# Patient Record
Sex: Female | Born: 1970 | Race: Black or African American | Hispanic: No | Smoking: Never smoker
Health system: Southern US, Community
[De-identification: ages and names within clinical notes are randomized; demographics above are authoritative.]

## PROBLEM LIST (undated history)

## (undated) DIAGNOSIS — E039 Hypothyroidism, unspecified: Secondary | ICD-10-CM

## (undated) HISTORY — DX: Hypothyroidism, unspecified: E03.9

## (undated) HISTORY — PX: COLONOSCOPY: SHX174

---

## 1998-10-30 ENCOUNTER — Ambulatory Visit (HOSPITAL_COMMUNITY): Admission: RE | Admit: 1998-10-30 | Discharge: 1998-10-30 | Payer: Self-pay | Admitting: Internal Medicine

## 1998-10-30 ENCOUNTER — Encounter: Payer: Self-pay | Admitting: Internal Medicine

## 2000-07-24 ENCOUNTER — Other Ambulatory Visit: Admission: RE | Admit: 2000-07-24 | Discharge: 2000-07-24 | Payer: Self-pay | Admitting: Obstetrics and Gynecology

## 2002-10-22 ENCOUNTER — Ambulatory Visit (HOSPITAL_COMMUNITY): Admission: RE | Admit: 2002-10-22 | Discharge: 2002-10-22 | Payer: Self-pay | Admitting: Obstetrics and Gynecology

## 2002-10-22 ENCOUNTER — Encounter: Payer: Self-pay | Admitting: Obstetrics and Gynecology

## 2004-11-01 ENCOUNTER — Other Ambulatory Visit: Admission: RE | Admit: 2004-11-01 | Discharge: 2004-11-01 | Payer: Self-pay | Admitting: Obstetrics and Gynecology

## 2005-12-05 ENCOUNTER — Other Ambulatory Visit: Admission: RE | Admit: 2005-12-05 | Discharge: 2005-12-05 | Payer: Self-pay | Admitting: Obstetrics and Gynecology

## 2007-06-10 ENCOUNTER — Encounter (INDEPENDENT_AMBULATORY_CARE_PROVIDER_SITE_OTHER): Payer: Self-pay | Admitting: Obstetrics and Gynecology

## 2007-06-10 ENCOUNTER — Inpatient Hospital Stay (HOSPITAL_COMMUNITY): Admission: RE | Admit: 2007-06-10 | Discharge: 2007-06-12 | Payer: Self-pay | Admitting: Obstetrics and Gynecology

## 2008-10-18 ENCOUNTER — Ambulatory Visit (HOSPITAL_COMMUNITY): Admission: RE | Admit: 2008-10-18 | Discharge: 2008-10-18 | Payer: Self-pay | Admitting: Obstetrics and Gynecology

## 2010-05-20 ENCOUNTER — Encounter: Payer: Self-pay | Admitting: Internal Medicine

## 2010-09-11 NOTE — Op Note (Signed)
NAME:  Sharon Barron, Sharon Barron                   ACCOUNT NO.:  [id number]   MEDICAL RECORD NO.:  [id number]          PATIENT TYPE:  INP   LOCATION:  9310                          FACILITY:  [facility]   PHYSICIAN:  Dois Davenport A. Rivard, M.D. DATE OF BIRTH:  [birth year]   DATE OF PROCEDURE:  06/10/2007  DATE OF DISCHARGE:                               OPERATIVE REPORT   PREOPERATIVE DIAGNOSIS:  Symptomatic large uterine fibroids.   POSTOPERATIVE DIAGNOSIS:  Symptomatic large uterine fibroids.   ANESTHESIA:  General, Dr. Tacy Dura.   PROCEDURE:  Uterine myomectomy.   SURGEON:  Dr. Estanislado Pandy.   ASSISTANT:  Henreitta Leber.   ESTIMATED BLOOD LOSS:  900 mL.   PROCEDURE:  After being informed of the planned procedure with the  possible complications including bleeding, infection, injury to bladder,  bowels or ureters, postoperative tubal occlusion, postoperative  hyperthermia, risk for requiring blood transfusion and risk of  hysterectomy, informed consent is obtained.  The patient is taken to OR  #3, given general anesthesia with endotracheal intubation without  complication.  She is placed in the dorsal decubitus position with knees  and hips flexed, prepped and draped in a sterile fashion including a  vaginal prep.  A Foley catheter is inserted in her bladder, a speculum  is inserted in the vagina.  The anterior lip of the cervix was grasped  with a tenaculum forceps.  The cervix is easily dilated with Hegar  dilator which then allows easy entry of an intrauterine balloon catheter  inflated with a 5 mL of saline.  The speculum and the tenaculum are  removed and the intrauterine Foley catheter is connected to a tube and a  syringe of diluted methylene blue.  The legs of the patient are then  straightened up and she is draped in a sterile fashion.   Her uterine size is approximately 24 weeks and we infiltrate the midline  from the umbilicus to the symphysis using Marcaine 0.25, 20 mL.  A  midline incision is  performed with knife and is brought down sharply to  the fascia.  The fascia is incised in a midline fashion.  The linea alba  is dissected and peritoneum is entered in a midline fashion.  The uterus  is easily exteriorized and we note multiple large fibroids with 2 fundal  subserosal, two large mid body posterior fibroids and two very large  lower uterine segments may be cervical fibroids and then small  subserosal ones. The left tube appears normal, the left ovary is normal,  has a little corpus luteum. The right tube is normal and the right ovary  is normal.  We decide to proceed with our initial incision being midline  and fundal to access a majority of the larger fibroids.  This is done  after infiltrating with vasopressin at a dilution of 100 units in 200 mL  of saline.  The serosa is then opened using cauterization. Fibroids are  grasped and sharply and bluntly dissected away from the myometrium. That  midline incision is now extended transversely on the fundus to access  more  fibroids and this is done again after infiltrating with  vasopressin. Multiple fibroids are removed through this cross-like  incision until we are unable to access more fibroids through this  incision.  We then proceed with systematic closure of the myometrium in  multiple planes of figure-of-eight stitches of zero Vicryl.  The  incision serosa is then closed with a baseball suture of 2-0 Vicryl.  Hemostasis is adequate.  Please note that during the dissection of the  main right cornual fibroid, we stay away from the tube which was  preoperatively blocked by the fibroid and we do the same for the main  left cornual fibroid staying away from the tube which was also  preoperatively blocked by the fibroid.   We then place our attention on the posterior wall of the uterus and we  extend the midline incision on the posterior wall to access four more  fibroids which are grasped and dissected bluntly and sharply  from the  myometrium.  The myometrium is again closed in multiple layers of figure-  of-eight stitches of #0 Vicryl and the serosa is closed with a baseball  suture of 2-0 Vicryl.  Hemostasis is deemed adequate.   At this point of the procedure, estimated blood loss is approximately  700 mL and because the patient is a participant in an autotransfusion  program, the decision is made to start giving her her 2 units of packed  red cells back.  We then put our attention on the lower uterine segment  anterior fibroids.  Because of the greatly reduction in size of the  uterus, retractors were placed and the bowels were retracted with  covered sponges.  Evaluation of this lower uterine segment reveals a 7-  cm right lower uterine segment fibroid which appears to be mainly a  broad ligament fibroid in  very close proximity to the right uterine  artery.  We then also note an 8 cm cervical fibroid which is very low in  the pelvis.  The broad ligament anteriorly is opened sharply and the  bladder is dissected away from these two fibroids.  We start with the  right lateral questionable round ligament fibroid by grasping it after  infiltrating its capsule with vasopressin at the same dilution  previously mentioned. We are very careful to apply traction towards the  left to stay away from the vascular pedicle of the uterus.  We are able  to sharply and bluntly dissect the fibroid away completely which leaves  the uterine artery completely skeletonized.  During that process, the  uterine vein is partially lacerated.  This vein is grasped with the  right angle and a suture is applied which is inefficient.  The ureter is  identified away from our site and again the breech in the uterine vein  is grasped with a right angle clamp and is now controlled with placement  of five hemoclip.   Hemostasis is then deemed adequate.   The bladder is dissected further away down to have access to the  cervical  fibroid.  This fibroid is very large and compromises the normal  direction of the cervix and so meticulous sharp dissection is performed  until the fibroid is completely removed.   Using multiple layers of figure-of-eight stitches of zero Vicryl, we are  able to reconstruct the lower uterine segments myometrium as well as the  right lower uterine segment myometrium.  A small bleeding on the left  lower uterine segment is controlled with two  figure-of-eight stitches of  2-0 Vicryl.  The pelvis is then profusely irrigated with warm saline and  hemostasis is deemed adequate.   Gelfoam is applied to the lower uterine segment and the anterior broad  ligament is brought back to the mid body of the uterus where it was  initially sectioned and it is closed with a running suture of 2-0  Vicryl.   We again profusely irrigate the pelvis with warm saline and note a  satisfactory hemostasis on all suture lines  Two sheets of intercede are  deposited on all suture lines to cover them completely. Packs and  retractors are removed.   The peritoneum is closed with a running suture of 2-0 Vicryl. Under  fascia hemostasis is completed with cautery and the fascia is closed  with two running sutures of zero Vicryl meeting midline.  The wound is  then irrigated with warm saline and hemostasis was achieved with  cauterization.  The skin is closed with a subcuticular suture of 3-0  Monocryl and Steri-Strips.   The uterine Foley catheter is removed after deflating the balloon.   Instruments and sponge count is complete x2. Estimated blood loss is at  900 mL while the patient received 2 units of packed red cells during the  procedure.   Finally the final count of fibroid removal is at 32 fibroids have been  removed for a total weight of 900 grams.   Instruments and sponge count is complete x2 and the procedure is very  well tolerated by the patient who is taken to recovery room in a well  and stable  condition.   SPECIMEN:  32 fibroids weighing 900 grams were sent to pathology.      Crist Fat Rivard, M.D.  Electronically Signed     SAR/MEDQ  D:  06/10/2007  T:  06/12/2007  Job:  [id number]

## 2010-09-11 NOTE — H&P (Signed)
NAME:  Sharon Barron, Sharon Barron                   ACCOUNT NO.:  [id number]   MEDICAL RECORD NO.:  [id number]          PATIENT TYPE:  AMB   LOCATION:  SDC                           [facility]ILITY:  [facility]   PHYSICIAN:  Dois Davenport A. Rivard, M.D. DATE OF BIRTH:  Dec 12, 1970   DATE OF ADMISSION:  DATE OF DISCHARGE:                              HISTORY & PHYSICAL   HISTORY OF PRESENT ILLNESS:  Sharon Barron is a 40 year old married [place]  female para 1-0-0-1 who presents for abdominal myomectomy because of  symptomatic uterine fibroids.  For the past 4 years, the patient has  experienced worsening symptomatic uterine fibroids in the form of  significant pelvic pressure, occasional urinary incontinence, incomplete  emptying of her bladder and postvoid dribbling.  The patient also has a  history of multifactorial infertility.  Her menstrual flow which lasts  only 3 days is characterized by her need to change a pad every 1-2 hours  accompanied by clots and 5/10 menstrual cramping on a 10-point pain  scale.  The patient only has to take over-the-counter analgesia,  however, to relieve her dysmenorrhea.  She denies any intermenstrual  bleeding, any dyspareunia or fever.  A pelvic ultrasound in August 2007  revealed a uterus measuring 6.6 cm x 11 cm x 10.4 cm.  Approximately 8  fibroids were measured at that time ranging from 2.5 cm to 6 cm.  During  that same interim, the patient's uterus was found on physical exam to be  approximately 18 weeks size.  More recently, the patient's physical exam  revealed a uterus measuring 26-28 weeks size.  The patient was given  both medical and surgical management options for her fibroids.  However  given the protracted nature of her symptoms, her expanding abdominal  girth and desire to preserve fertility, the patient has decided to  proceed with abdominal myomectomy.   PAST MEDICAL HISTORY:  OB history gravida 1, para 1-0-0-1.  The patient  had a spontaneous vaginal delivery in 1999.   GYN history menarche at 39  years old.  Last menstrual period May 23, 2007.  The patient is not  using any method of contraception.  She has a remote history of an  abnormal Pap smear.  However, her Pap smears since that time have been  normal with the most recent being November 2008.  She denies any history  of sexually transmitted diseases.  Medical history is negative.   SURGICAL HISTORY:  In 1990, she underwent an appendectomy.  She denies  any problems with anesthesia.  Denies any history of blood transfusion.   FAMILY HISTORY:  Positive for diabetes mellitus.   HABITS:  She does not use tobacco, alcohol or illicit drugs.   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   CURRENT MEDICATIONS:  1. INH 300 mg daily (the patient had a positive PPD with a negative      chest x-ray, however, was prescribed this as a precaution).  2. Slow Fe 1 tablet daily.  3. Centrum multivitamins 1  tablet daily.   ALLERGIES:  LORCET which causes hallucinations.   REVIEW OF SYSTEMS:  The patient denies any chest pain, shortness of  breath, any nausea, vomiting, diarrhea, any headache, vision changes,  myalgias.  Except as is mentioned in history of present illness,  the  patient's review of system is negative.   PHYSICAL EXAMINATION:  VITAL SIGNS:  Blood pressure 124/72, pulse 78,  weight is 148.  Height is 5 feet 1 inch.  NECK:  Supple without masses.  There is no thyromegaly or cervical  adenopathy.  HEART:  Regular rate and rhythm.  LUNGS:  Clear.  BACK:  No CVA tenderness.  ABDOMEN:  There is a firm mass which is tender arising from the pelvis  to approximately 3 fingerbreadths above the umbilicus along its right  upper border and approximately 1 fingerbreadth above the umbilicus along  its left upper border.  There is no appreciable organomegaly.  EXTREMITIES:  Without clubbing, cyanosis or edema.  PELVIC:  EG/BUS is within  normal limits.  Vagina is rugous.  Cervix is  nontender without lesions.  Uterus appears 26-28 weeks size, irregular  and mildly tender.  Adnexa, no palpable masses or tenderness.   IMPRESSION:  1. Symptomatic uterine fibroids.  2. Menorrhagia.  3. Dysmenorrhea.   DISPOSITION:  A discussion was held with the patient regarding the  indications for her procedure along with its risks which include, but  are not limited to reaction to anesthesia, damage to adjacent organs,  infection and excessive bleeding.  The patient has already banked 2  units of blood which was given autologously.  She has consented to  proceed with a myomectomy at [facility] June 10, 2007 at 8:30 a.m.      [place] J. Adline Peals.      Crist Fat Rivard, M.D.  Electronically Signed    EJP/MEDQ  D:  06/04/2007  T:  06/05/2007  Job:  [id number]

## 2010-09-14 NOTE — Discharge Summary (Signed)
NAME:  Sharon Barron, Sharon Barron                   ACCOUNT NO.:  [id number]   MEDICAL RECORD NO.:  [id number]          PATIENT TYPE:  INP   LOCATION:  9310                          [facility]ACILITY:  [facility]   PHYSICIAN:  Dois Davenport A. Rivard, M.D. DATE OF BIRTH:  January 23, 1971   DATE OF ADMISSION:  06/10/2007  DATE OF DISCHARGE:  06/12/2007                               DISCHARGE SUMMARY   DISCHARGE DIAGNOSES:  Symptomatic uterine fibroids, menorrhagia, and  dysmenorrhea.   OPERATION:  On the date of admission, the patient underwent an abdominal  myomectomy along with transfusion of 2 units of autologous packed red  blood cells, tolerating procedure well.  The patient had removed  multiple fibroids equaling 32 parts with an aggregate weight of 900.8 g.   HISTORY OF PRESENT ILLNESS:  Ms. Myhand is a 40 year old married [place]  female, para 1-0-0-1, who presents for an abdominal myomectomy because  of symptomatic uterine fibroids.  Please see the patient's dictated  history and physical examination for details.   PREOPERATIVE PHYSICAL EXAMINATION:  VITAL SIGNS:  Blood pressure 124/72,  pulse is 78, weight is 148, height 5 feet 1 inch tall.  GENERAL:  Within normal limit.  ABDOMEN:  It was noted on abdominal exam that there was a firm mass,  which was tender, arising from the pelvis approximately 3 fingerbreadths  above the umbilicus along its right upper border, and approximately 1  fingerbreadth above the umbilicus along the left upper border.  There  was no appreciable organomegaly.  PELVIC:  EG, BUS was within normal limits.  Vagina was rugosed.  Cervix  was nontender without lesions.  The uterus appeared 26 to 28 weeks'  size, irregular, and mildly tender.  Adnexa revealed no palpable masses  or tenderness.   HOSPITAL COURSE:  On the date of admission, the patient underwent  aforementioned procedures, tolerated them all well.  Postoperative  course was marked by mild tachycardia with the patient's heart rate  ranging from 100 to 117.  The patient's postoperative hemoglobin  (following transfusion of 2 units of autologous packed red blood cells)  was 10.9.  The patient's preop hemoglobin was 13.2.  On postop day # 2,  the patient had resumed bowel and bladder function, and was asymptomatic  with her anemic state, and was therefore deemed ready for discharge  home.   DISCHARGE MEDICATIONS:  The patient was advised to follow instructions  on her home medication reconciliation form.  She was further prescribed:  1. Colace 100 mg twice daily until her bowel movements are regular.  2. Ibuprofen 600 mg with food every 6 hours for 5 days and then as      needed for pain.  3. 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  4. Dilaudid 2 mg 1-2 tablets every 4 hours as needed for moderate-to-      severe pain.  5. Iron 1 tablet twice daily.   FOLLOWUP:  The patient is scheduled for a 6 weeks' postoperative visit  with Dr. Estanislado Pandy on July 21, 2007 at 10:15 a.m.   DISCHARGE INSTRUCTIONS:  The patient was given a copy of [facility] postoperative instruction sheet.  She was further  advised to avoid driving for 2 weeks, heavy lifting for 4 weeks, and of  course a 6 weeks that she may shower, she may walk up steps.  The  patient's diet was without restriction.   FINAL PATHOLOGY:  Uterine fibroid, excision:  Leiomyomata.      Elmira J. Adline Peals.      Crist Fat Rivard, M.D.  Electronically Signed    EJP/MEDQ  D:  07/16/2007  T:  07/17/2007  Job:  [id number]

## 2011-01-18 LAB — CBC
HCT: 30 — ABNORMAL LOW
HCT: 39
Hemoglobin: 10.2 — ABNORMAL LOW
Hemoglobin: 10.9 — ABNORMAL LOW
Hemoglobin: 13.2
MCHC: 33.7
MCHC: 34.1
MCHC: 34.4
MCV: 87.4
MCV: 87.9
Platelets: 272
RBC: 3.43 — ABNORMAL LOW
RBC: 3.66 — ABNORMAL LOW
RBC: 4.44
RDW: 13.5
WBC: 13.9 — ABNORMAL HIGH
WBC: 16 — ABNORMAL HIGH
WBC: 4.8

## 2011-01-18 LAB — TYPE AND SCREEN
ABO/RH(D): O POS
Antibody Screen: NEGATIVE

## 2011-01-18 LAB — URINALYSIS, ROUTINE W REFLEX MICROSCOPIC
Bilirubin Urine: NEGATIVE
Glucose, UA: NEGATIVE
Hgb urine dipstick: NEGATIVE
Protein, ur: NEGATIVE
Specific Gravity, Urine: 1.01
Urobilinogen, UA: 0.2

## 2011-01-18 LAB — PREGNANCY, URINE: Preg Test, Ur: NEGATIVE

## 2011-04-29 ENCOUNTER — Other Ambulatory Visit: Payer: Self-pay | Admitting: Internal Medicine

## 2011-04-29 DIAGNOSIS — Z1231 Encounter for screening mammogram for malignant neoplasm of breast: Secondary | ICD-10-CM

## 2011-05-13 ENCOUNTER — Ambulatory Visit
Admission: RE | Admit: 2011-05-13 | Discharge: 2011-05-13 | Disposition: A | Discharge status: Home / Self Care | Payer: Commercial Indemnity | Source: Ambulatory Visit | Attending: Internal Medicine | Admitting: Internal Medicine

## 2011-05-13 DIAGNOSIS — Z1231 Encounter for screening mammogram for malignant neoplasm of breast: Secondary | ICD-10-CM

## 2013-05-11 ENCOUNTER — Other Ambulatory Visit: Payer: Self-pay

## 2013-05-11 DIAGNOSIS — Z1231 Encounter for screening mammogram for malignant neoplasm of breast: Secondary | ICD-10-CM

## 2013-05-12 ENCOUNTER — Ambulatory Visit
Admission: RE | Admit: 2013-05-12 | Discharge: 2013-05-12 | Disposition: A | Discharge status: Home / Self Care | Payer: Commercial Indemnity | Source: Ambulatory Visit

## 2013-05-12 DIAGNOSIS — Z1231 Encounter for screening mammogram for malignant neoplasm of breast: Secondary | ICD-10-CM

## 2015-06-01 DIAGNOSIS — E039 Hypothyroidism, unspecified: Secondary | ICD-10-CM | POA: Insufficient documentation

## 2015-11-21 DIAGNOSIS — D259 Leiomyoma of uterus, unspecified: Secondary | ICD-10-CM | POA: Insufficient documentation

## 2015-11-21 DIAGNOSIS — D219 Benign neoplasm of connective and other soft tissue, unspecified: Secondary | ICD-10-CM | POA: Insufficient documentation

## 2016-04-29 HISTORY — PX: ABDOMINAL HYSTERECTOMY: SHX81

## 2016-12-12 DIAGNOSIS — H5213 Myopia, bilateral: Secondary | ICD-10-CM | POA: Insufficient documentation

## 2016-12-12 DIAGNOSIS — H16223 Keratoconjunctivitis sicca, not specified as Sjogren's, bilateral: Secondary | ICD-10-CM | POA: Insufficient documentation

## 2017-01-01 DIAGNOSIS — N8502 Endometrial intraepithelial neoplasia [EIN]: Secondary | ICD-10-CM | POA: Insufficient documentation

## 2017-05-29 ENCOUNTER — Ambulatory Visit (INDEPENDENT_AMBULATORY_CARE_PROVIDER_SITE_OTHER): Payer: No Typology Code available for payment source | Admitting: Nurse Practitioner

## 2017-05-29 ENCOUNTER — Encounter: Payer: Self-pay | Admitting: Nurse Practitioner

## 2017-05-29 VITALS — BP 120/86 | HR 84 | Temp 97.5°F | Ht 61.0 in | Wt 154.0 lb

## 2017-05-29 DIAGNOSIS — E039 Hypothyroidism, unspecified: Secondary | ICD-10-CM | POA: Diagnosis not present

## 2017-05-29 DIAGNOSIS — Z86018 Personal history of other benign neoplasm: Secondary | ICD-10-CM | POA: Insufficient documentation

## 2017-05-29 LAB — T4, FREE: Free T4: 0.85 ng/dL (ref 0.60–1.60)

## 2017-05-29 LAB — TSH: TSH: 1.17 u[IU]/mL (ref 0.35–4.50)

## 2017-05-29 MED ORDER — LEVOTHYROXINE SODIUM 50 MCG PO TABS: 50.0000 ug | ORAL_TABLET | Freq: Every day | ORAL | 3 refills | Status: DC

## 2017-05-29 NOTE — Patient Instructions (Addendum)
Stable thyroid function. Continue current dose.

## 2017-05-29 NOTE — Progress Notes (Signed)
Subjective:  Patient ID: Sharon Barron, female    DOB: 1970-11-23  Age: 47 y.o. MRN: [id number]  CC: Establish Care (est care/ thyroid and medication)  Thyroid Problem  Presents for follow-up visit. Patient reports no anxiety, cold intolerance, constipation, depressed mood, diaphoresis, diarrhea, dry skin, fatigue, hair loss, heat intolerance, hoarse voice, leg swelling, menstrual problem, nail problem, palpitations, tremors, visual change, weight gain or weight loss. The symptoms have been stable.  labs last done 07/2016 by [facility]. Use of current dose for several years.  Outpatient Medications Prior to Visit  Medication Sig Dispense Refill  . levothyroxine (SYNTHROID, LEVOTHROID) 50 MCG tablet TAKE ONE TABLET BY MOUTH ONCE DAILY    . aspirin EC 81 MG tablet Take by mouth.     No facility-administered medications prior to visit.    Social History   Socioeconomic History  . Marital status: Married    Spouse name: Not on file  . Number of children: Not on file  . Years of education: Not on file  . Highest education level: Not on file  Social Needs  . Financial resource strain: Not on file  . Food insecurity - worry: Not on file  . Food insecurity - inability: Not on file  . Transportation needs - medical: Not on file  . Transportation needs - non-medical: Not on file  Occupational History  . Not on file  Tobacco Use  . Smoking status: Never Smoker  . Smokeless tobacco: Never Used  Substance and Sexual Activity  . Alcohol use: No    Frequency: Never  . Drug use: No  . Sexual activity: Yes    Birth control/protection: Surgical  Other Topics Concern  . Not on file  Social History Narrative  . Not on file   Family History  Family history unknown: Yes    ROS See HPI  Objective:  BP 120/86   Pulse 84   Temp (!) 97.5 F (36.4 C)   Ht 5\' 1"  (1.549 m)   Wt 154 lb (69.9 kg)   SpO2 99%   BMI 29.10 kg/m   BP Readings from Last 3 Encounters:  05/29/17 120/86    Wt  Readings from Last 3 Encounters:  05/29/17 154 lb (69.9 kg)    Physical Exam  Constitutional: She is oriented to person, place, and time. No distress.  Neck: Normal range of motion. Neck supple. No thyromegaly present.  Cardiovascular: Normal rate, regular rhythm and normal heart sounds.  Pulmonary/Chest: Effort normal and breath sounds normal.  Lymphadenopathy:    She has no cervical adenopathy.  Neurological: She is alert and oriented to person, place, and time.  Skin: Skin is warm and dry.  Psychiatric: She has a normal mood and affect. Her behavior is normal. Thought content normal.  Vitals reviewed.   Lab Results  Component Value Date   WBC 16.0 (H) 06/12/2007   HGB 10.2 (L) 06/12/2007   HCT 30.0 (L) 06/12/2007   PLT 207 06/12/2007   TSH 1.17 05/29/2017    Mm Digital Screening  Result Date: 05/12/2013 CLINICAL DATA:  Screening. EXAM: DIGITAL SCREENING BILATERAL MAMMOGRAM WITH CAD COMPARISON:  Previous exam(s). ACR Breast Density Category c: The breast tissue is heterogeneously dense, which may obscure small masses. FINDINGS: There are no findings suspicious for malignancy. Images were processed with CAD. IMPRESSION: No mammographic evidence of malignancy. A result letter of this screening mammogram will be mailed directly to the patient. RECOMMENDATION: Screening mammogram in one year. (Code:SM-B-01Y) BI-RADS CATEGORY  1: Negative Electronically Signed   By: Lovey Newcomer M.D.   On: 05/12/2013 15:19   Updated mammogram done 02/2017 (normal per [facility])  Assessment & Plan:   Sharon Barron was seen today for establish care.  Diagnoses and all orders for this visit:  Hypothyroidism (acquired) -     TSH -     T4, free -     levothyroxine (SYNTHROID, LEVOTHROID) 50 MCG tablet; Take 1 tablet (50 mcg total) by mouth daily.   I have changed Sharon Barron. Veno's levothyroxine. I am also having her maintain her aspirin EC.  Meds ordered this encounter  Medications  . levothyroxine (SYNTHROID,  LEVOTHROID) 50 MCG tablet    Sig: Take 1 tablet (50 mcg total) by mouth daily.    Dispense:  90 tablet    Refill:  3    Order Specific Question:   Supervising Provider    Answer:   Sharon Barron [[id number]]    Follow-up: Return in about 6 months (around 11/26/2017) for CPE(fasting).  Sharon Lacy, NP

## 2017-08-05 ENCOUNTER — Telehealth: Payer: Self-pay | Admitting: Nurse Practitioner

## 2017-08-05 DIAGNOSIS — Z202 Contact with and (suspected) exposure to infections with a predominantly sexual mode of transmission: Secondary | ICD-10-CM

## 2017-08-05 DIAGNOSIS — R300 Dysuria: Secondary | ICD-10-CM

## 2017-08-05 NOTE — Telephone Encounter (Signed)
Ms. Sharon Barron called with complains of urinary frequency, dysuria, and blood in urine x 1week. She was treated for UTI 1week ago, but symptoms have not improved. She was treated with amoxicillin. She also had GC/chlamydia and trichomoniasis done. Test was negative. She will like urinalysis repeated. She will also like HIV and RPR checked due to husband being unfaithful. I informed her to go to lab tomorrow morning for blood draw and urine collection. She verbalized understanding.

## 2017-08-06 ENCOUNTER — Other Ambulatory Visit (INDEPENDENT_AMBULATORY_CARE_PROVIDER_SITE_OTHER): Payer: PRIVATE HEALTH INSURANCE

## 2017-08-06 ENCOUNTER — Ambulatory Visit: Payer: Commercial Indemnity | Admitting: Nurse Practitioner

## 2017-08-06 DIAGNOSIS — R300 Dysuria: Secondary | ICD-10-CM | POA: Diagnosis not present

## 2017-08-06 DIAGNOSIS — Z202 Contact with and (suspected) exposure to infections with a predominantly sexual mode of transmission: Secondary | ICD-10-CM

## 2017-08-08 LAB — URINALYSIS W MICROSCOPIC + REFLEX CULTURE
BACTERIA UA: NONE SEEN /HPF
Bilirubin Urine: NEGATIVE
GLUCOSE, UA: NEGATIVE
Hyaline Cast: NONE SEEN /LPF
KETONES UR: NEGATIVE
Nitrites, Initial: NEGATIVE
PH: 7.5 (ref 5.0–8.0)
Protein, ur: NEGATIVE
RBC / HPF: NONE SEEN /HPF (ref 0–2)
Specific Gravity, Urine: 1.004 (ref 1.001–1.03)

## 2017-08-08 LAB — URINE CULTURE
MICRO NUMBER: 90447320
SPECIMEN QUALITY: ADEQUATE

## 2017-08-08 LAB — RPR: RPR Ser Ql: NONREACTIVE

## 2017-08-08 LAB — CULTURE INDICATED

## 2017-08-08 LAB — HIV ANTIBODY (ROUTINE TESTING W REFLEX): HIV: NONREACTIVE

## 2017-09-10 ENCOUNTER — Encounter: Payer: Self-pay | Admitting: Nurse Practitioner

## 2017-09-10 ENCOUNTER — Ambulatory Visit: Payer: Commercial Indemnity | Admitting: Nurse Practitioner

## 2017-09-10 VITALS — BP 110/80 | HR 78 | Temp 98.2°F | Ht 61.0 in | Wt 154.4 lb

## 2017-09-10 DIAGNOSIS — Z136 Encounter for screening for cardiovascular disorders: Secondary | ICD-10-CM

## 2017-09-10 DIAGNOSIS — N951 Menopausal and female climacteric states: Secondary | ICD-10-CM

## 2017-09-10 DIAGNOSIS — Z13220 Encounter for screening for lipoid disorders: Secondary | ICD-10-CM | POA: Diagnosis not present

## 2017-09-10 DIAGNOSIS — Z0000 Encounter for general adult medical examination without abnormal findings: Secondary | ICD-10-CM

## 2017-09-10 DIAGNOSIS — Z0001 Encounter for general adult medical examination with abnormal findings: Secondary | ICD-10-CM

## 2017-09-10 LAB — CBC
HCT: 41.5 % (ref 36.0–46.0)
Hemoglobin: 14 g/dL (ref 12.0–15.0)
MCHC: 33.7 g/dL (ref 30.0–36.0)
MCV: 86.8 fl (ref 78.0–100.0)
Platelets: 339 10*3/uL (ref 150.0–400.0)
RBC: 4.78 Mil/uL (ref 3.87–5.11)
RDW: 13.6 % (ref 11.5–15.5)
WBC: 4.9 10*3/uL (ref 4.0–10.5)

## 2017-09-10 LAB — COMPREHENSIVE METABOLIC PANEL
ALBUMIN: 4 g/dL (ref 3.5–5.2)
ALK PHOS: 49 U/L (ref 39–117)
ALT: 13 U/L (ref 0–35)
AST: 15 U/L (ref 0–37)
BUN: 10 mg/dL (ref 6–23)
CO2: 25 mEq/L (ref 19–32)
Calcium: 9.4 mg/dL (ref 8.4–10.5)
Chloride: 107 mEq/L (ref 96–112)
Creatinine, Ser: 0.74 mg/dL (ref 0.40–1.20)
GFR: 107.19 mL/min (ref >60.00)
GLUCOSE: 90 mg/dL (ref 70–99)
POTASSIUM: 3.7 meq/L (ref 3.5–5.1)
Sodium: 140 mEq/L (ref 135–145)
TOTAL PROTEIN: 7.1 g/dL (ref 6.0–8.3)
Total Bilirubin: 0.5 mg/dL (ref 0.2–1.2)

## 2017-09-10 LAB — LIPID PANEL
CHOLESTEROL: 197 mg/dL (ref 0–200)
HDL: 52.5 mg/dL (ref >39.00)
LDL Cholesterol: 129 mg/dL — ABNORMAL HIGH (ref 0–99)
NonHDL: 144.12
Total CHOL/HDL Ratio: 4
Triglycerides: 77 mg/dL (ref 0.0–149.0)
VLDL: 15.4 mg/dL (ref 0.0–40.0)

## 2017-09-10 MED ORDER — VENLAFAXINE HCL ER 37.5 MG PO CP24: 37.5000 mg | ORAL_CAPSULE | Freq: Every day | ORAL | 2 refills | Status: DC

## 2017-09-10 NOTE — Progress Notes (Signed)
Subjective:    Patient ID: Sharon Barron, female    DOB: Sep 27, 1970, 46 y.o.   MRN: [id number]  Patient presents today for complete physical   HPI   Hot Flashes: She reports hot flashes in last 32months due to complete hysterectomy.describes as overwhelming hot sensation and breaking out in sweats. interfering with sleep. Denies any depression or anxiety. No other associated symptoms, no other known aggravating factor. She will like to use effexor to manage symptoms. Has not tried any home remedies.  Immunizations: (TDAP, Hep C screen, Pneumovax, Influenza, zoster)  Health Maintenance  Topic Date Due  . Flu Shot  11/27/2017  . Tetanus Vaccine  08/27/2021  . HIV Screening  Completed   Diet:regular.  Weight:  Wt Readings from Last 3 Encounters:  09/10/17 154 lb 6.4 oz (70 kg)  05/29/17 154 lb (69.9 kg)   Exercise:walking.  Fall Risk: Fall Risk  09/10/2017 05/29/2017  Falls in the past year? No No   Home Safety:home with daughter.  Depression/Suicide: Depression screen [facility] 2/9 09/10/2017 05/29/2017  Decreased Interest 0 0  Down, Depressed, Hopeless 0 0  PHQ - 2 Score 0 0   Mammogram (yearly, >73yrs):up to date.  Vision:up to date  Dental:up to date.   Medications and allergies reviewed with patient and updated if appropriate.  Patient Active Problem List   Diagnosis Date Noted  . S/P hysterectomy with oophorectomy 09/11/2017  . Vasomotor symptoms due to menopause 09/10/2017  . Hypothyroidism (acquired) 05/29/2017  . History of uterine fibroid 05/29/2017    Current Outpatient Medications on File Prior to Visit  Medication Sig Dispense Refill  . aspirin EC 81 MG tablet Take by mouth.    . levothyroxine (SYNTHROID, LEVOTHROID) 50 MCG tablet Take 1 tablet (50 mcg total) by mouth daily. 90 tablet 3   No current facility-administered medications on file prior to visit.     Past Medical History:  Diagnosis Date  . Hypothyroidism     Past Surgical History:    Procedure Laterality Date  . ABDOMINAL HYSTERECTOMY  2018  . COLONOSCOPY     2015 (normal per patient)    Social History   Socioeconomic History  . Marital status: Divorced    Spouse name: Not on file  . Number of children: 1  . Years of education: Not on file  . Highest education level: Not on file  Occupational History  . Not on file  Social Needs  . Financial resource strain: Not on file  . Food insecurity:    Worry: Not on file    Inability: Not on file  . Transportation needs:    Medical: Not on file    Non-medical: Not on file  Tobacco Use  . Smoking status: Never Smoker  . Smokeless tobacco: Never Used  Substance and Sexual Activity  . Alcohol use: No    Frequency: Never  . Drug use: No  . Sexual activity: Yes    Birth control/protection: Surgical  Lifestyle  . Physical activity:    Days per week: Not on file    Minutes per session: Not on file  . Stress: Not on file  Relationships  . Social connections:    Talks on phone: Not on file    Gets together: Not on file    Attends religious service: Not on file    Active member of club or organization: Not on file    Attends meetings of clubs or organizations: Not on file    Relationship  status: Not on file  Other Topics Concern  . Not on file  Social History Narrative  . Not on file    Family History  Family history unknown: Yes        Review of Systems  Constitutional: Negative for fever, malaise/fatigue and weight loss.  HENT: Negative for congestion and sore throat.   Eyes:       Negative for visual changes  Respiratory: Negative for cough and shortness of breath.   Cardiovascular: Negative for chest pain, palpitations and leg swelling.  Gastrointestinal: Negative for blood in stool, constipation, diarrhea and heartburn.  Genitourinary: Negative for dysuria, frequency and urgency.  Musculoskeletal: Negative for falls, joint pain and myalgias.  Skin: Negative for rash.  Neurological: Negative  for dizziness, sensory change and headaches.  Endo/Heme/Allergies: Does not bruise/bleed easily.  Psychiatric/Behavioral: Negative for depression, substance abuse and suicidal ideas. The patient is not nervous/anxious.     Objective:   Vitals:   09/10/17 0814  BP: 110/80  Pulse: 78  Temp: 98.2 F (36.8 C)  SpO2: 99%    Body mass index is 29.17 kg/m.   Physical Examination:  Physical Exam  Constitutional: She is oriented to person, place, and time. She appears well-developed. No distress.  HENT:  Right Ear: External ear normal.  Left Ear: External ear normal.  Nose: Nose normal.  Mouth/Throat: Oropharynx is clear and moist. No oropharyngeal exudate.  Eyes: Pupils are equal, round, and reactive to light. Conjunctivae and EOM are normal.  Neck: Normal range of motion. Neck supple. No thyromegaly present.  Cardiovascular: Normal rate, regular rhythm and normal heart sounds.  Pulmonary/Chest: Effort normal and breath sounds normal. No respiratory distress. She exhibits no tenderness.  Deferred breast exam to GYN  Abdominal: Soft. Bowel sounds are normal. She exhibits no distension. There is no tenderness.  Genitourinary:  Genitourinary Comments: Declined to GYN  Musculoskeletal: Normal range of motion. She exhibits no edema.  Lymphadenopathy:    She has no cervical adenopathy.  Neurological: She is alert and oriented to person, place, and time. She has normal reflexes.  Skin: Skin is warm and dry. No rash noted.  Psychiatric: She has a normal mood and affect. Her behavior is normal. Thought content normal.  Vitals reviewed.  ASSESSMENT and PLAN:  Shemica was seen today for annual exam.  Diagnoses and all orders for this visit:  Encounter for preventative adult health care exam with abnormal findings -     CBC -     Lipid panel -     Comprehensive metabolic panel  Encounter for lipid screening for cardiovascular disease -     Lipid panel  Vasomotor symptoms due to  menopause -     venlafaxine XR (EFFEXOR-XR) 37.5 MG 24 hr capsule; Take 1 capsule (37.5 mg total) by mouth daily with breakfast.  S/P hysterectomy with oophorectomy   No problem-specific Assessment & Plan notes found for this encounter.    Recent Results (from the past 2160 hour(s))  Urinalysis w microscopic + reflex cultur     Status: Abnormal   Collection Time: 08/06/17  8:19 AM  Result Value Ref Range   Color, Urine YELLOW YELLOW   APPearance CLEAR CLEAR   Specific Gravity, Urine 1.004 1.001 - 1.03   pH 7.5 5.0 - 8.0   Glucose, UA NEGATIVE NEGATIVE   Bilirubin Urine NEGATIVE NEGATIVE   Ketones, ur NEGATIVE NEGATIVE   Hgb urine dipstick TRACE (A) NEGATIVE   Protein, ur NEGATIVE NEGATIVE   Nitrites,  Initial NEGATIVE NEGATIVE   Leukocyte Esterase 1+ (A) NEGATIVE   WBC, UA 0-5 0 - 5 /HPF   RBC / HPF NONE SEEN 0 - 2 /HPF   Squamous Epithelial / LPF 0-5 < OR = 5 /HPF   Bacteria, UA NONE SEEN NONE SEEN /HPF   Hyaline Cast NONE SEEN NONE SEEN /LPF  RPR     Status: None   Collection Time: 08/06/17  8:19 AM  Result Value Ref Range   RPR Ser Ql NON-REACTIVE NON-REACTI  HIV antibody     Status: None   Collection Time: 08/06/17  8:19 AM  Result Value Ref Range   HIV 1&2 Ab, 4th Generation NON-REACTIVE NON-REACTI    Comment: HIV-1 antigen and HIV-1/HIV-2 antibodies were not detected. There is no laboratory evidence of HIV infection. Marland Kitchen PLEASE NOTE: This information has been disclosed to you from records whose confidentiality may be protected by state law.  If your state requires such protection, then the state law prohibits you from making any further disclosure of the information without the specific written consent of the person to whom it pertains, or as otherwise permitted by law. A general authorization for the release of medical or other information is NOT sufficient for this purpose. . For additional information please refer  to http://education.questdiagnostics.com/faq/FAQ106 (This link is being provided for informational/ educational purposes only.) . Marland Kitchen The performance of this assay has not been clinically validated in patients less than 46 years old. Marland Kitchen   REFLEXIVE URINE CULTURE     Status: None   Collection Time: 08/06/17  8:19 AM  Result Value Ref Range   REFLEXIVE URINE CULTURE CULTURE INDICATED - RESULTS TO FOLLOW   Urine Culture     Status: None   Collection Time: 08/06/17  8:19 AM  Result Value Ref Range   MICRO NUMBER: [id number]    SPECIMEN QUALITY: ADEQUATE    Sample Source URINE    STATUS: FINAL    Result:      Three or more organisms present, each greater than 10,000 cu/mL. May represent normal flora contamination from external genitalia. No further testing is required.  CBC     Status: None   Collection Time: 09/10/17  8:56 AM  Result Value Ref Range   WBC 4.9 4.0 - 10.5 K/uL   RBC 4.78 3.87 - 5.11 Mil/uL   Platelets 339.0 150.0 - 400.0 K/uL   Hemoglobin 14.0 12.0 - 15.0 g/dL   HCT 41.5 36.0 - 46.0 %   MCV 86.8 78.0 - 100.0 fl   MCHC 33.7 30.0 - 36.0 g/dL   RDW 13.6 11.5 - 15.5 %  Lipid panel     Status: Abnormal   Collection Time: 09/10/17  8:56 AM  Result Value Ref Range   Cholesterol 197 0 - 200 mg/dL    Comment: ATP III Classification       Desirable:  < 200 mg/dL               Borderline High:  200 - 239 mg/dL          High:  > = 240 mg/dL   Triglycerides 77.0 0.0 - 149.0 mg/dL    Comment: Normal:  <150 mg/dLBorderline High:  150 - 199 mg/dL   HDL 52.50 >39.00 mg/dL   VLDL 15.4 0.0 - 40.0 mg/dL   LDL Cholesterol 129 (H) 0 - 99 mg/dL   Total CHOL/HDL Ratio 4     Comment:  Men          Women1/2 Average Risk     3.4          3.3Average Risk          5.0          4.42X Average Risk          9.6          7.13X Average Risk          15.0          11.0                       NonHDL 144.12     Comment: NOTE:  Non-HDL goal should be 30 mg/dL higher than patient's LDL goal  (i.e. LDL goal of < 70 mg/dL, would have non-HDL goal of < 100 mg/dL)  Comprehensive metabolic panel     Status: None   Collection Time: 09/10/17  8:56 AM  Result Value Ref Range   Sodium 140 135 - 145 mEq/L   Potassium 3.7 3.5 - 5.1 mEq/L   Chloride 107 96 - 112 mEq/L   CO2 25 19 - 32 mEq/L   Glucose, Bld 90 70 - 99 mg/dL   BUN 10 6 - 23 mg/dL   Creatinine, Ser 0.74 0.40 - 1.20 mg/dL   Total Bilirubin 0.5 0.2 - 1.2 mg/dL   Alkaline Phosphatase 49 39 - 117 U/L   AST 15 0 - 37 U/L   ALT 13 0 - 35 U/L   Total Protein 7.1 6.0 - 8.3 g/dL   Albumin 4.0 3.5 - 5.2 g/dL   Calcium 9.4 8.4 - 10.5 mg/dL   GFR 107.19 >60.00 mL/min   Follow up: Return in about 3 months (around 12/11/2017) for hot flashes.  Wilfred Lacy, NP

## 2017-09-10 NOTE — Patient Instructions (Addendum)
Go to lab for blood draw. Sign medical release to get records from PAP report from GYN.  Send mychart message by 09/19/2017 if any adverse effects with effexor.  Menopause Menopause is the normal time of life when menstrual periods stop completely. Menopause is complete when you have missed 12 consecutive menstrual periods. It usually occurs between the ages of 55 years and 73 years. Very rarely does a woman develop menopause before the age of 25 years. At menopause, your ovaries stop producing the female hormones estrogen and progesterone. This can cause undesirable symptoms and also affect your health. Sometimes the symptoms may occur 4-5 years before the menopause begins. There is no relationship between menopause and:  Oral contraceptives.  Number of children you had.  Race.  The age your menstrual periods started (menarche).  Heavy smokers and very thin women may develop menopause earlier in life. What are the causes?  The ovaries stop producing the female hormones estrogen and progesterone. Other causes include:  Surgery to remove both ovaries.  The ovaries stop functioning for no known reason.  Tumors of the pituitary gland in the brain.  Medical disease that affects the ovaries and hormone production.  Radiation treatment to the abdomen or pelvis.  Chemotherapy that affects the ovaries.  What are the signs or symptoms?  Hot flashes.  Night sweats.  Decrease in sex drive.  Vaginal dryness and thinning of the vagina causing painful intercourse.  Dryness of the skin and developing wrinkles.  Headaches.  Tiredness.  Irritability.  Memory problems.  Weight gain.  Bladder infections.  Hair growth of the face and chest.  Infertility. More serious symptoms include:  Loss of bone (osteoporosis) causing breaks (fractures).  Depression.  Hardening and narrowing of the arteries (atherosclerosis) causing heart attacks and strokes.  How is this  diagnosed?  When the menstrual periods have stopped for 12 straight months.  Physical exam.  Hormone studies of the blood. How is this treated? There are many treatment choices and nearly as many questions about them. The decisions to treat or not to treat menopausal changes is an individual choice made with your health care provider. Your health care provider can discuss the treatments with you. Together, you can decide which treatment will work best for you. Your treatment choices may include:  Hormone therapy (estrogen and progesterone).  Non-hormonal medicines.  Treating the individual symptoms with medicine (for example antidepressants for depression).  Herbal medicines that may help specific symptoms.  Counseling by a psychiatrist or psychologist.  Group therapy.  Lifestyle changes including: ? Eating healthy. ? Regular exercise. ? Limiting caffeine and alcohol. ? Stress management and meditation.  No treatment.  Follow these instructions at home:  Take the medicine your health care provider gives you as directed.  Get plenty of sleep and rest.  Exercise regularly.  Eat a diet that contains calcium (good for the bones) and soy products (acts like estrogen hormone).  Avoid alcoholic beverages.  Do not smoke.  If you have hot flashes, dress in layers.  Take supplements, calcium, and vitamin D to strengthen bones.  You can use over-the-counter lubricants or moisturizers for vaginal dryness.  Group therapy is sometimes very helpful.  Acupuncture may be helpful in some cases. Contact a health care provider if:  You are not sure you are in menopause.  You are having menopausal symptoms and need advice and treatment.  You are still having menstrual periods after age 19 years.  You have pain with intercourse.  Menopause  is complete (no menstrual period for 12 months) and you develop vaginal bleeding.  You need a referral to a specialist (gynecologist,  psychiatrist, or psychologist) for treatment. Get help right away if:  You have severe depression.  You have excessive vaginal bleeding.  You fell and think you have a broken bone.  You have pain when you urinate.  You develop leg or chest pain.  You have a fast pounding heart beat (palpitations).  You have severe headaches.  You develop vision problems.  You feel a lump in your breast.  You have abdominal pain or severe indigestion. This information is not intended to replace advice given to you by your health care provider. Make sure you discuss any questions you have with your health care provider. Document Released: 07/06/2003 Document Revised: 09/21/2015 Document Reviewed: 11/12/2012 Elsevier Interactive Patient Education  2017 [employer].

## 2017-09-11 ENCOUNTER — Encounter: Payer: Self-pay | Admitting: Nurse Practitioner

## 2017-09-11 DIAGNOSIS — Z90710 Acquired absence of both cervix and uterus: Secondary | ICD-10-CM | POA: Insufficient documentation

## 2017-09-11 DIAGNOSIS — Z90721 Acquired absence of ovaries, unilateral: Secondary | ICD-10-CM

## 2018-08-05 ENCOUNTER — Telehealth: Payer: Self-pay | Admitting: Nurse Practitioner

## 2018-08-05 NOTE — Telephone Encounter (Signed)
I called and left message on patient voicemail per Wilfred Lacy request call office and schedule a follow up for hypothyroidism. Patient has not been seen since 08/2017.

## 2018-10-09 ENCOUNTER — Telehealth: Payer: Self-pay | Admitting: Nurse Practitioner

## 2018-10-09 ENCOUNTER — Encounter: Payer: Self-pay | Admitting: Nurse Practitioner

## 2018-10-09 ENCOUNTER — Ambulatory Visit (INDEPENDENT_AMBULATORY_CARE_PROVIDER_SITE_OTHER): Payer: BC Managed Care – PPO | Admitting: Nurse Practitioner

## 2018-10-09 ENCOUNTER — Other Ambulatory Visit: Payer: Self-pay

## 2018-10-09 VITALS — BP 100/62 | HR 82 | Ht 61.0 in | Wt 160.0 lb

## 2018-10-09 DIAGNOSIS — E039 Hypothyroidism, unspecified: Secondary | ICD-10-CM | POA: Diagnosis not present

## 2018-10-09 MED ORDER — LEVOTHYROXINE SODIUM 50 MCG PO TABS: 50.0000 ug | ORAL_TABLET | Freq: Every day | ORAL | 0 refills | Status: DC

## 2018-10-09 NOTE — Telephone Encounter (Signed)
error 

## 2018-10-09 NOTE — Patient Instructions (Addendum)
Go to lab for blood draw at 2:15pm Will send additional refill after review of lab results Schedule CPE in July of August

## 2018-10-09 NOTE — Progress Notes (Signed)
Virtual Visit via Video Note  I connected with Sharon Barron on 10/09/18 at  1:00 PM EDT by a video enabled telemedicine application and verified that I am speaking with the correct person using two identifiers.  Location: Patient: Home Provider: Office   I discussed the limitations of evaluation and management by telemedicine and the availability of in person appointments. The patient expressed understanding and agreed to proceed.  CC: medication refill, only wants TSH and t4 lab done.  History of Present Illness: Hypothyroidism: She denies any acute complains. Takes 16mcg of hypothyroidism.  Review of Systems  Constitutional: Negative.   Respiratory: Negative.   Cardiovascular: Negative.   Gastrointestinal: Negative for constipation.  Skin: Negative.   Neurological: Negative for dizziness.  Psychiatric/Behavioral: Negative.    Observations/Objective: Physical Exam  Constitutional: She is oriented to person, place, and time. No distress.  Pulmonary/Chest: Effort normal.  Neurological: She is alert and oriented to person, place, and time.  Psychiatric: She has a normal mood and affect. Her behavior is normal. Judgment and thought content normal.  Vitals reviewed.  Assessment and Plan: Sharon Barron was seen today for follow-up.  Diagnoses and all orders for this visit:  Hypothyroidism (acquired) -     TSH -     T4, free -     levothyroxine (SYNTHROID) 50 MCG tablet; Take 1 tablet (50 mcg total) by mouth daily.   Follow Up Instructions: Go to lab for blood draw at 2:15pm Will send additional refill after review of lab results Schedule CPE in July of August   I discussed the assessment and treatment plan with the patient. The patient was provided an opportunity to ask questions and all were answered. The patient agreed with the plan and demonstrated an understanding of the instructions.   The patient was advised to call back or seek an in-person evaluation if the symptoms  worsen or if the condition fails to improve as anticipated.   Wilfred Lacy, NP

## 2018-10-09 NOTE — Telephone Encounter (Signed)
Patient called to schedule an appt for medication refill with Baldo Ash. I offered patient a virtual visit and she was okay with that. I schedule the patient for October 16, 2018 for a virtual visit and was trying to get patient insurance info. Patient then said that she will wait to come in the office to show her insurance card. She also said that she will see about getting the medication that she need at an urgent care. Patient then said that she wanted a CPE with Baldo Ash and that she will call back to schedule appt. She canceled the appt for October 16, 2018.

## 2018-10-10 LAB — TSH: TSH: 0.99 mIU/L

## 2018-10-10 LAB — T4, FREE: Free T4: 1.2 ng/dL (ref 0.8–1.8)

## 2018-10-12 MED ORDER — LEVOTHYROXINE SODIUM 50 MCG PO TABS: 50.0000 ug | ORAL_TABLET | Freq: Every day | ORAL | 1 refills | Status: DC

## 2018-10-12 NOTE — Addendum Note (Signed)
Addended by: Leana Gamer on: 10/12/2018 09:48 AM   Modules accepted: Orders

## 2018-10-15 ENCOUNTER — Telehealth: Payer: Self-pay | Admitting: Nurse Practitioner

## 2018-10-15 NOTE — Telephone Encounter (Signed)
I called and left message on patient voicemail to call office and schedule appointment for CPE with Nche.

## 2018-10-16 ENCOUNTER — Ambulatory Visit: Payer: PRIVATE HEALTH INSURANCE | Admitting: Nurse Practitioner

## 2018-11-13 ENCOUNTER — Telehealth: Payer: Self-pay | Admitting: Nurse Practitioner

## 2018-11-13 DIAGNOSIS — E039 Hypothyroidism, unspecified: Secondary | ICD-10-CM

## 2018-11-13 MED ORDER — LEVOTHYROXINE SODIUM 50 MCG PO TABS: 50.0000 ug | ORAL_TABLET | Freq: Every day | ORAL | 0 refills | Status: DC

## 2018-11-13 NOTE — Telephone Encounter (Signed)
Rx sent 90 days supply, pt will do self pay for this round until she due for another refills. Pt is aware.

## 2018-11-13 NOTE — Telephone Encounter (Signed)
Please advise, okey to send in med?   Copied from Rhea [phone]. Topic: General - Other >> Nov 13, 2018 11:00 AM Leward Quan A wrote: Reason for CRM: Patient called to say that she had traveled and some how misplaced her levothyroxine (SYNTHROID) 50 MCG tablet. She is asking C Nche to please send a new Rx to the Pharmacy for her say that she will pay out of pocket for it Please advise. Per patient it have been 3 days since she last took this medication so it is urgently needed.

## 2018-11-13 NOTE — Telephone Encounter (Signed)
Ok to resend prescription

## 2018-11-19 ENCOUNTER — Telehealth: Payer: Self-pay | Admitting: Nurse Practitioner

## 2018-11-19 NOTE — Telephone Encounter (Signed)

## 2018-11-20 ENCOUNTER — Other Ambulatory Visit: Payer: Self-pay

## 2018-11-20 ENCOUNTER — Ambulatory Visit (INDEPENDENT_AMBULATORY_CARE_PROVIDER_SITE_OTHER): Payer: BC Managed Care – PPO | Admitting: Nurse Practitioner

## 2018-11-20 ENCOUNTER — Encounter: Payer: Self-pay | Admitting: Nurse Practitioner

## 2018-11-20 VITALS — BP 97/74 | HR 79 | Temp 97.6°F | Resp 18 | Ht 61.0 in | Wt 161.0 lb

## 2018-11-20 DIAGNOSIS — Z136 Encounter for screening for cardiovascular disorders: Secondary | ICD-10-CM | POA: Diagnosis not present

## 2018-11-20 DIAGNOSIS — Z0000 Encounter for general adult medical examination without abnormal findings: Secondary | ICD-10-CM | POA: Diagnosis not present

## 2018-11-20 DIAGNOSIS — Z13220 Encounter for screening for lipoid disorders: Secondary | ICD-10-CM | POA: Diagnosis not present

## 2018-11-20 NOTE — Patient Instructions (Signed)
Go to lab for blood draw.  Have mammogram and colonoscopy report faxed to me.   Health Maintenance, Female Adopting a healthy lifestyle and getting preventive care are important in promoting health and wellness. Ask your health care provider about:  The right schedule for you to have regular tests and exams.  Things you can do on your own to prevent diseases and keep yourself healthy. What should I know about diet, weight, and exercise? Eat a healthy diet   Eat a diet that includes plenty of vegetables, fruits, low-fat dairy products, and lean protein.  Do not eat a lot of foods that are high in solid fats, added sugars, or sodium. Maintain a healthy weight Body mass index (BMI) is used to identify weight problems. It estimates body fat based on height and weight. Your health care provider can help determine your BMI and help you achieve or maintain a healthy weight. Get regular exercise Get regular exercise. This is one of the most important things you can do for your health. Most adults should:  Exercise for at least 150 minutes each week. The exercise should increase your heart rate and make you sweat (moderate-intensity exercise).  Do strengthening exercises at least twice a week. This is in addition to the moderate-intensity exercise.  Spend less time sitting. Even light physical activity can be beneficial. Watch cholesterol and blood lipids Have your blood tested for lipids and cholesterol at 50 years of age, then have this test every 5 years. Have your cholesterol levels checked more often if:  Your lipid or cholesterol levels are high.  You are older than 50 years of age.  You are at high risk for heart disease. What should I know about cancer screening? Depending on your health history and family history, you may need to have cancer screening at various ages. This may include screening for:  Breast cancer.  Cervical cancer.  Colorectal cancer.  Skin cancer.   Lung cancer. What should I know about heart disease, diabetes, and high blood pressure? Blood pressure and heart disease  High blood pressure causes heart disease and increases the risk of stroke. This is more likely to develop in people who have high blood pressure readings, are of African descent, or are overweight.  Have your blood pressure checked: ? Every 3-5 years if you are 67-56 years of age. ? Every year if you are 75 years old or older. Diabetes Have regular diabetes screenings. This checks your fasting blood sugar level. Have the screening done:  Once every three years after age 32 if you are at a normal weight and have a low risk for diabetes.  More often and at a younger age if you are overweight or have a high risk for diabetes. What should I know about preventing infection? Hepatitis B If you have a higher risk for hepatitis B, you should be screened for this virus. Talk with your health care provider to find out if you are at risk for hepatitis B infection. Hepatitis C Testing is recommended for:  Everyone born from [birth year] through 1967.  Anyone with known risk factors for hepatitis C. Sexually transmitted infections (STIs)  Get screened for STIs, including gonorrhea and chlamydia, if: ? You are sexually active and are younger than 50 years of age. ? You are older than 50 years of age and your health care provider tells you that you are at risk for this type of infection. ? Your sexual activity has changed since you were last  screened, and you are at increased risk for chlamydia or gonorrhea. Ask your health care provider if you are at risk.  Ask your health care provider about whether you are at high risk for HIV. Your health care provider may recommend a prescription medicine to help prevent HIV infection. If you choose to take medicine to prevent HIV, you should first get tested for HIV. You should then be tested every 3 months for as long as you are taking the  medicine. Pregnancy  If you are about to stop having your period (premenopausal) and you may become pregnant, seek counseling before you get pregnant.  Take 400 to 800 micrograms (mcg) of folic acid every day if you become pregnant.  Ask for birth control (contraception) if you want to prevent pregnancy. Osteoporosis and menopause Osteoporosis is a disease in which the bones lose minerals and strength with aging. This can result in bone fractures. If you are 13 years old or older, or if you are at risk for osteoporosis and fractures, ask your health care provider if you should:  Be screened for bone loss.  Take a calcium or vitamin D supplement to lower your risk of fractures.  Be given hormone replacement therapy (HRT) to treat symptoms of menopause. Follow these instructions at home: Lifestyle  Do not use any products that contain nicotine or tobacco, such as cigarettes, e-cigarettes, and chewing tobacco. If you need help quitting, ask your health care provider.  Do not use street drugs.  Do not share needles.  Ask your health care provider for help if you need support or information about quitting drugs. Alcohol use  Do not drink alcohol if: ? Your health care provider tells you not to drink. ? You are pregnant, may be pregnant, or are planning to become pregnant.  If you drink alcohol: ? Limit how much you use to 0-1 drink a day. ? Limit intake if you are breastfeeding.  Be aware of how much alcohol is in your drink. In the [place] one drink equals one 12 oz bottle of beer (355 mL), one 5 oz glass of wine (148 mL), or one 1 oz glass of hard liquor (44 mL). General instructions  Schedule regular health, dental, and eye exams.  Stay current with your vaccines.  Tell your health care provider if: ? You often feel depressed. ? You have ever been abused or do not feel safe at home. Summary  Adopting a healthy lifestyle and getting preventive care are important in  promoting health and wellness.  Follow your health care provider's instructions about healthy diet, exercising, and getting tested or screened for diseases.  Follow your health care provider's instructions on monitoring your cholesterol and blood pressure. This information is not intended to replace advice given to you by your health care provider. Make sure you discuss any questions you have with your health care provider. Document Released: 10/29/2010 Document Revised: 04/08/2018 Document Reviewed: 04/08/2018 Elsevier Patient Education  2020 [employer].

## 2018-11-20 NOTE — Progress Notes (Signed)
Subjective:    Patient ID: Sharon Barron, female    DOB: 1970/08/10, 48 y.o.   MRN: [id number]  Patient presents today for complete physical   HPI  Sexual History (orientation,birth control, marital status, STD):divorced, sexually active, s/p hysterectomy  Depression/Suicide: Depression screen [facility] 2/9 11/20/2018 10/09/2018 09/10/2017 05/29/2017  Decreased Interest 0 0 0 0  Down, Depressed, Hopeless 0 0 0 0  PHQ - 2 Score 0 0 0 0   Vision:up to date, use of corrective lens  Dental:up to date  Immunizations: (TDAP, Hep C screen, Pneumovax, Influenza, zoster)  Health Maintenance  Topic Date Due  . Mammogram  07/04/2018  . Colon Cancer Screening  07/04/2018  . Flu Shot  11/28/2018  . Tetanus Vaccine  08/27/2021  . HIV Screening  Completed   Diet:heart healthy.  Weight:  Wt Readings from Last 3 Encounters:  11/20/18 161 lb (73 kg)  10/09/18 160 lb (72.6 kg)  09/10/17 154 lb 6.4 oz (70 kg)    Exercise:walking daily 30-50mins  Fall Risk: Fall Risk  11/20/2018 10/09/2018 09/10/2017 05/29/2017  Falls in the past year? 0 0 No No  Follow up Falls evaluation completed - - -   Medications and allergies reviewed with patient and updated if appropriate.  Patient Active Problem List   Diagnosis Date Noted  . S/P hysterectomy with oophorectomy 09/11/2017  . Vasomotor symptoms due to menopause 09/10/2017  . History of uterine fibroid 05/29/2017  . 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 Keratoconjunctivitis sicca of both eyes not specified as Sjogren's 12/12/2016  . Myopia of both eyes 12/12/2016  . Fibroid 11/21/2015  . Acquired hypothyroidism 06/01/2015    Current Outpatient Medications on File Prior to Visit  Medication Sig Dispense Refill  . ergocalciferol (VITAMIN D2) 1.25 MG (50000 UT) capsule Take 50,000 Units by mouth once a week.    . levothyroxine (SYNTHROID) 50 MCG tablet Take 1 tablet (50 mcg total) by mouth daily before breakfast. 90 tablet 0  . aspirin EC 81 MG  tablet Take by mouth.     No current facility-administered medications on file prior to visit.     Past Medical History:  Diagnosis Date  . Hypothyroidism     Past Surgical History:  Procedure Laterality Date  . ABDOMINAL HYSTERECTOMY  2018  . COLONOSCOPY     2015 (normal per patient)    Social History   Socioeconomic History  . Marital status: Divorced    Spouse name: Not on file  . Number of children: 1  . Years of education: Not on file  . Highest education level: Not on file  Occupational History  . Not on file  Social Needs  . Financial resource strain: Not hard at all  . Food insecurity    Worry: Never true    Inability: Never true  . Transportation needs    Medical: Not on file    Non-medical: Not on file  Tobacco Use  . Smoking status: Never Smoker  . Smokeless tobacco: Never Used  Substance and Sexual Activity  . Alcohol use: No    Frequency: Never  . Drug use: No  . Sexual activity: Yes    Birth control/protection: Surgical  Lifestyle  . Physical activity    Days per week: Not on file    Minutes per session: Not on file  . Stress: Not on file  Relationships  . Social Herbalist on phone: Not on file    Gets together: Not  on file    Attends religious service: Not on file    Active member of club or organization: Not on file    Attends meetings of clubs or organizations: Not on file    Relationship status: Not on file  Other Topics Concern  . Not on file  Social History Narrative  . Not on file    Family History  Family history unknown: Yes        ROS  Objective:   Vitals:   11/20/18 1342  BP: 97/74  Pulse: 79  Resp: 18  Temp: 97.6 F (36.4 C)  SpO2: 97%    Body mass index is 30.42 kg/m.   Physical Examination:  Physical Exam Vitals signs reviewed.  Constitutional:      General: She is not in acute distress.    Appearance: She is well-developed.  HENT:     Right Ear: Tympanic membrane, ear canal and  external ear normal.     Left Ear: Tympanic membrane, ear canal and external ear normal.     Nose: Nose normal.     Mouth/Throat:     Mouth: Mucous membranes are moist.     Pharynx: No oropharyngeal exudate.  Eyes:     Extraocular Movements: Extraocular movements intact.     Conjunctiva/sclera: Conjunctivae normal.  Neck:     Musculoskeletal: Normal range of motion and neck supple.  Cardiovascular:     Rate and Rhythm: Normal rate and regular rhythm.     Heart sounds: Normal heart sounds.  Pulmonary:     Effort: Pulmonary effort is normal. No respiratory distress.     Breath sounds: Normal breath sounds.  Chest:     Chest wall: No tenderness.  Abdominal:     General: Bowel sounds are normal.     Palpations: Abdomen is soft.  Genitourinary:    Comments: Deferred pelvic and breast exam to GYN Musculoskeletal: Normal range of motion.     Right lower leg: No edema.     Left lower leg: No edema.  Lymphadenopathy:     Cervical: No cervical adenopathy.  Skin:    General: Skin is warm and dry.     Findings: No erythema.  Neurological:     Mental Status: She is alert and oriented to person, place, and time.     Deep Tendon Reflexes: Reflexes are normal and symmetric.  Psychiatric:        Mood and Affect: Mood normal.        Behavior: Behavior normal.        Thought Content: Thought content normal.    ASSESSMENT and PLAN:  Fawn was seen today for annual exam.  Diagnoses and all orders for this visit:  Preventative health care -     Lipid panel -     Comprehensive metabolic panel  Encounter for lipid screening for cardiovascular disease -     Lipid panel   No problem-specific Assessment & Plan notes found for this encounter.     Problem List Items Addressed This Visit    None    Visit Diagnoses    Preventative health care    -  Primary   Relevant Orders   Lipid panel   Comprehensive metabolic panel   Encounter for lipid screening for cardiovascular disease        Relevant Orders   Lipid panel       Follow up: Return in about 6 months (around 05/23/2019) for Hypothyroidism.  Wilfred Lacy, NP

## 2018-11-21 LAB — COMPREHENSIVE METABOLIC PANEL
AG Ratio: 1.8 (calc) (ref 1.0–2.5)
ALT: 16 U/L (ref 6–29)
AST: 24 U/L (ref 10–35)
Albumin: 4.4 g/dL (ref 3.6–5.1)
Alkaline phosphatase (APISO): 57 U/L (ref 37–153)
BUN: 11 mg/dL (ref 7–25)
CO2: 30 mmol/L (ref 20–32)
Calcium: 9.8 mg/dL (ref 8.6–10.4)
Chloride: 104 mmol/L (ref 98–110)
Creat: 0.73 mg/dL (ref 0.50–1.05)
Globulin: 2.4 g/dL (calc) (ref 1.9–3.7)
Glucose, Bld: 83 mg/dL (ref 65–99)
Potassium: 4.1 mmol/L (ref 3.5–5.3)
Sodium: 139 mmol/L (ref 135–146)
Total Bilirubin: 0.6 mg/dL (ref 0.2–1.2)
Total Protein: 6.8 g/dL (ref 6.1–8.1)

## 2018-11-21 LAB — LIPID PANEL
Cholesterol: 198 mg/dL (ref <200)
HDL: 60 mg/dL (ref >=50)
LDL Cholesterol (Calc): 122 mg/dL (calc) — ABNORMAL HIGH
Non-HDL Cholesterol (Calc): 138 mg/dL (calc) — ABNORMAL HIGH (ref <130)
Total CHOL/HDL Ratio: 3.3 (calc) (ref <5.0)
Triglycerides: 68 mg/dL (ref <150)

## 2018-12-11 DIAGNOSIS — Z1231 Encounter for screening mammogram for malignant neoplasm of breast: Secondary | ICD-10-CM | POA: Diagnosis not present

## 2018-12-11 LAB — HM MAMMOGRAPHY

## 2018-12-16 ENCOUNTER — Encounter: Payer: Self-pay | Admitting: Nurse Practitioner

## 2018-12-16 NOTE — Progress Notes (Signed)
Abstracted result and sent to scan  

## 2019-03-04 ENCOUNTER — Other Ambulatory Visit: Payer: Self-pay | Admitting: Nurse Practitioner

## 2019-03-04 DIAGNOSIS — E039 Hypothyroidism, unspecified: Secondary | ICD-10-CM

## 2019-06-07 ENCOUNTER — Other Ambulatory Visit: Payer: Self-pay | Admitting: Nurse Practitioner

## 2019-06-07 DIAGNOSIS — E039 Hypothyroidism, unspecified: Secondary | ICD-10-CM

## 2019-06-08 MED ORDER — LEVOTHYROXINE SODIUM 50 MCG PO TABS: 50.0000 ug | ORAL_TABLET | Freq: Every day | ORAL | 0 refills | Status: DC

## 2019-06-08 NOTE — Addendum Note (Signed)
Addended by: Wilfred Lacy L on: 06/08/2019 11:46 AM   Modules accepted: Orders

## 2019-10-02 ENCOUNTER — Other Ambulatory Visit: Payer: Self-pay | Admitting: Nurse Practitioner

## 2019-10-02 DIAGNOSIS — E039 Hypothyroidism, unspecified: Secondary | ICD-10-CM

## 2019-12-30 ENCOUNTER — Other Ambulatory Visit: Payer: Self-pay

## 2019-12-31 ENCOUNTER — Encounter: Payer: Self-pay | Admitting: Nurse Practitioner

## 2019-12-31 ENCOUNTER — Ambulatory Visit (INDEPENDENT_AMBULATORY_CARE_PROVIDER_SITE_OTHER): Payer: BC Managed Care – PPO | Admitting: Nurse Practitioner

## 2019-12-31 VITALS — BP 110/72 | HR 72 | Temp 97.0°F | Ht 62.0 in | Wt 160.6 lb

## 2019-12-31 DIAGNOSIS — E039 Hypothyroidism, unspecified: Secondary | ICD-10-CM

## 2019-12-31 DIAGNOSIS — Z111 Encounter for screening for respiratory tuberculosis: Secondary | ICD-10-CM | POA: Diagnosis not present

## 2019-12-31 DIAGNOSIS — Z0000 Encounter for general adult medical examination without abnormal findings: Secondary | ICD-10-CM

## 2019-12-31 DIAGNOSIS — E7800 Pure hypercholesterolemia, unspecified: Secondary | ICD-10-CM | POA: Diagnosis not present

## 2019-12-31 LAB — COMPREHENSIVE METABOLIC PANEL
ALT: 17 U/L (ref 0–35)
AST: 27 U/L (ref 0–37)
Albumin: 4.9 g/dL (ref 3.5–5.2)
Alkaline Phosphatase: 66 U/L (ref 39–117)
BUN: 15 mg/dL (ref 6–23)
CO2: 24 mEq/L (ref 19–32)
Calcium: 10 mg/dL (ref 8.4–10.5)
Chloride: 105 mEq/L (ref 96–112)
Creatinine, Ser: 0.77 mg/dL (ref 0.40–1.20)
GFR: 95.44 mL/min (ref >60.00)
Glucose, Bld: 71 mg/dL (ref 70–99)
Potassium: 3.8 mEq/L (ref 3.5–5.1)
Sodium: 139 mEq/L (ref 135–145)
Total Bilirubin: 0.6 mg/dL (ref 0.2–1.2)
Total Protein: 8 g/dL (ref 6.0–8.3)

## 2019-12-31 LAB — TSH: TSH: 0.64 u[IU]/mL (ref 0.35–4.50)

## 2019-12-31 LAB — CBC WITH DIFFERENTIAL/PLATELET
Basophils Absolute: 0 10*3/uL (ref 0.0–0.1)
Basophils Relative: 0.8 % (ref 0.0–3.0)
Eosinophils Absolute: 0 10*3/uL (ref 0.0–0.7)
Eosinophils Relative: 0.3 % (ref 0.0–5.0)
HCT: 46.6 % — ABNORMAL HIGH (ref 36.0–46.0)
Hemoglobin: 15.3 g/dL — ABNORMAL HIGH (ref 12.0–15.0)
Lymphocytes Relative: 49.4 % — ABNORMAL HIGH (ref 12.0–46.0)
Lymphs Abs: 2.1 10*3/uL (ref 0.7–4.0)
MCHC: 32.8 g/dL (ref 30.0–36.0)
MCV: 86.2 fl (ref 78.0–100.0)
Monocytes Absolute: 0.3 10*3/uL (ref 0.1–1.0)
Monocytes Relative: 6.1 % (ref 3.0–12.0)
Neutro Abs: 1.9 10*3/uL (ref 1.4–7.7)
Neutrophils Relative %: 43.4 % (ref 43.0–77.0)
Platelets: 293 10*3/uL (ref 150.0–400.0)
RBC: 5.4 Mil/uL — ABNORMAL HIGH (ref 3.87–5.11)
RDW: 13.8 % (ref 11.5–15.5)
WBC: 4.3 10*3/uL (ref 4.0–10.5)

## 2019-12-31 LAB — LIPID PANEL
Cholesterol: 222 mg/dL — ABNORMAL HIGH (ref 0–200)
HDL: 58.3 mg/dL (ref >39.00)
LDL Cholesterol: 151 mg/dL — ABNORMAL HIGH (ref 0–99)
NonHDL: 163.47
Total CHOL/HDL Ratio: 4
Triglycerides: 63 mg/dL (ref 0.0–149.0)
VLDL: 12.6 mg/dL (ref 0.0–40.0)

## 2019-12-31 LAB — T4, FREE: Free T4: 0.85 ng/dL (ref 0.60–1.60)

## 2019-12-31 MED ORDER — LEVOTHYROXINE SODIUM 50 MCG PO TABS: 50.0000 ug | ORAL_TABLET | Freq: Every day | ORAL | 3 refills | Status: DC

## 2019-12-31 NOTE — Assessment & Plan Note (Signed)
Abnormal lipid panel: elevated LDL and Total Cholesterol. You have a low risk for cardiovascular disease 1.2%. Maintain heart healthy diet. Will repeat in 1year

## 2019-12-31 NOTE — Assessment & Plan Note (Addendum)
Stable weight, no hair loss, no palpitations Repeat TSH and T4, lipid panel. Maintain current dose while waiting for results

## 2019-12-31 NOTE — Patient Instructions (Addendum)
Please have mammogram and colonoscopy results faxed to me.  Stable renal, liver, thyroid and CBC Abnormal lipid panel: elevated LDL and Total Cholesterol. You have a low risk for cardiovascular disease. Maintain heart healthy diet. Will repeat in 1year  Send copy of school form through mychart   Preventive Care 42-51 Years Old, Female Preventive care refers to visits with your health care provider and lifestyle choices that can promote health and wellness. This includes:  A yearly physical exam. This may also be called an annual well check.  Regular dental visits and eye exams.  Immunizations.  Screening for certain conditions.  Healthy lifestyle choices, such as eating a healthy diet, getting regular exercise, not using drugs or products that contain nicotine and tobacco, and limiting alcohol use. What can I expect for my preventive care visit? Physical exam Your health care provider will check your:  Height and weight. This may be used to calculate body mass index (BMI), which tells if you are at a healthy weight.  Heart rate and blood pressure.  Skin for abnormal spots. Counseling Your health care provider may ask you questions about your:  Alcohol, tobacco, and drug use.  Emotional well-being.  Home and relationship well-being.  Sexual activity.  Eating habits.  Work and work Statistician.  Method of birth control.  Menstrual cycle.  Pregnancy history. What immunizations do I need?  Influenza (flu) vaccine  This is recommended every year. Tetanus, diphtheria, and pertussis (Tdap) vaccine  You may need a Td booster every 10 years. Varicella (chickenpox) vaccine  You may need this if you have not been vaccinated. Zoster (shingles) vaccine  You may need this after age 38. Measles, mumps, and rubella (MMR) vaccine  You may need at least one dose of MMR if you were born in 1957 or later. You may also need a second dose. Pneumococcal conjugate (PCV13)  vaccine  You may need this if you have certain conditions and were not previously vaccinated. Pneumococcal polysaccharide (PPSV23) vaccine  You may need one or two doses if you smoke cigarettes or if you have certain conditions. Meningococcal conjugate (MenACWY) vaccine  You may need this if you have certain conditions. Hepatitis A vaccine  You may need this if you have certain conditions or if you travel or work in places where you may be exposed to hepatitis A. Hepatitis B vaccine  You may need this if you have certain conditions or if you travel or work in places where you may be exposed to hepatitis B. Haemophilus influenzae type b (Hib) vaccine  You may need this if you have certain conditions. Human papillomavirus (HPV) vaccine  If recommended by your health care provider, you may need three doses over 6 months. You may receive vaccines as individual doses or as more than one vaccine together in one shot (combination vaccines). Talk with your health care provider about the risks and benefits of combination vaccines. What tests do I need? Blood tests  Lipid and cholesterol levels. These may be checked every 5 years, or more frequently if you are over 86 years old.  Hepatitis C test.  Hepatitis B test. Screening  Lung cancer screening. You may have this screening every year starting at age 49 if you have a 30-pack-year history of smoking and currently smoke or have quit within the past 15 years.  Colorectal cancer screening. All adults should have this screening starting at age 62 and continuing until age 36. Your health care provider may recommend screening at  age 65 if you are at increased risk. You will have tests every 1-10 years, depending on your results and the type of screening test.  Diabetes screening. This is done by checking your blood sugar (glucose) after you have not eaten for a while (fasting). You may have this done every 1-3 years.  Mammogram. This may be  done every 1-2 years. Talk with your health care provider about when you should start having regular mammograms. This may depend on whether you have a family history of breast cancer.  BRCA-related cancer screening. This may be done if you have a family history of breast, ovarian, tubal, or peritoneal cancers.  Pelvic exam and Pap test. This may be done every 3 years starting at age 8. Starting at age 35, this may be done every 5 years if you have a Pap test in combination with an HPV test. Other tests  Sexually transmitted disease (STD) testing.  Bone density scan. This is done to screen for osteoporosis. You may have this scan if you are at high risk for osteoporosis. Follow these instructions at home: Eating and drinking  Eat a diet that includes fresh fruits and vegetables, whole grains, lean protein, and low-fat dairy.  Take vitamin and mineral supplements as recommended by your health care provider.  Do not drink alcohol if: ? Your health care provider tells you not to drink. ? You are pregnant, may be pregnant, or are planning to become pregnant.  If you drink alcohol: ? Limit how much you have to 0-1 drink a day. ? Be aware of how much alcohol is in your drink. In the [place] one drink equals one 12 oz bottle of beer (355 mL), one 5 oz glass of wine (148 mL), or one 1 oz glass of hard liquor (44 mL). Lifestyle  Take daily care of your teeth and gums.  Stay active. Exercise for at least 30 minutes on 5 or more days each week.  Do not use any products that contain nicotine or tobacco, such as cigarettes, e-cigarettes, and chewing tobacco. If you need help quitting, ask your health care provider.  If you are sexually active, practice safe sex. Use a condom or other form of birth control (contraception) in order to prevent pregnancy and STIs (sexually transmitted infections).  If told by your health care provider, take low-dose aspirin daily starting at age 27. What's  next?  Visit your health care provider once a year for a well check visit.  Ask your health care provider how often you should have your eyes and teeth checked.  Stay up to date on all vaccines. This information is not intended to replace advice given to you by your health care provider. Make sure you discuss any questions you have with your health care provider. Document Revised: 12/25/2017 Document Reviewed: 12/25/2017 Elsevier Patient Education  2020 [employer].

## 2019-12-31 NOTE — Progress Notes (Signed)
Subjective:    Patient ID: Sharon Barron, female    DOB: 1970-08-24, 49 y.o.   MRN: [id number]  Patient presents today for CPE and eval of chronic conditions: hypothyroidism  HPI Acquired hypothyroidism Stable weight, no hair loss, no palpitations Repeat TSH and T4, lipid panel. Maintain current dose while waiting for results she also needs forms completed for school  Sexual History (orientation,birth control, marital status, STD):s/p hysterectomy, up to date with mammogram. Denies need for STD screen. Plans to schedule appt for colonoscopy with Dr. Collene Mares.  Depression/Suicide: Depression screen [facility] 2/9 12/31/2019 11/20/2018 10/09/2018 09/10/2017 05/29/2017  Decreased Interest 0 0 0 0 0  Down, Depressed, Hopeless 0 0 0 0 0  PHQ - 2 Score 0 0 0 0 0   Vision:up to date  Dental:up to date  Immunizations: (TDAP, Hep C screen, Pneumovax, Influenza, zoster)  Health Maintenance  Topic Date Due  . Colon Cancer Screening  Never done  . Flu Shot  11/28/2019  .  Hepatitis C: One time screening is recommended by Center for Disease Control  (CDC) for  adults born from [birth year] through 1967.   12/30/2020*  . Mammogram  12/10/2020  . Tetanus Vaccine  08/27/2021  . COVID-19 Vaccine  Completed  . HIV Screening  Completed  *Topic was postponed. The date shown is not the original due date.   Diet:heart healthy Exercise: daily  Weight:  Wt Readings from Last 3 Encounters:  12/31/19 160 lb 9.6 oz (72.8 kg)  11/20/18 161 lb (73 kg)  10/09/18 160 lb (72.6 kg)   Fall Risk: Fall Risk  11/20/2018 10/09/2018 09/10/2017 05/29/2017  Falls in the past year? 0 0 No No  Follow up Falls evaluation completed - - -   Medications and allergies reviewed with patient and updated if appropriate.  Patient Active Problem List   Diagnosis Date Noted  . S/P hysterectomy with oophorectomy 09/11/2017  . Vasomotor symptoms due to menopause 09/10/2017  . Keratoconjunctivitis sicca of both eyes not specified as Sjogren's  12/12/2016  . Myopia of both eyes 12/12/2016  . Acquired hypothyroidism 06/01/2015    Current Outpatient Medications on File Prior to Visit  Medication Sig Dispense Refill  . aspirin EC 81 MG tablet Take by mouth.    . ergocalciferol (VITAMIN D2) 1.25 MG (50000 UT) capsule Take 50,000 Units by mouth once a week.    . levothyroxine (EUTHYROX) 50 MCG tablet Take 1 tablet (50 mcg total) by mouth daily before breakfast. No additional refills with out office visit 90 tablet 0   No current facility-administered medications on file prior to visit.    Past Medical History:  Diagnosis Date  . Hypothyroidism     Past Surgical History:  Procedure Laterality Date  . ABDOMINAL HYSTERECTOMY  2018  . COLONOSCOPY     2015 (normal per patient)    Social History   Socioeconomic History  . Marital status: Divorced    Spouse name: Not on file  . Number of children: 1  . Years of education: Not on file  . Highest education level: Not on file  Occupational History  . Not on file  Tobacco Use  . Smoking status: Never Smoker  . Smokeless tobacco: Never Used  Vaping Use  . Vaping Use: Never used  Substance and Sexual Activity  . Alcohol use: No  . Drug use: No  . Sexual activity: Yes    Birth control/protection: Surgical  Other Topics Concern  . Not on file  Social History Narrative  . Not on file   Social Determinants of Health   Financial Resource Strain:   . Difficulty of Paying Living Expenses: Not on file  Food Insecurity:   . Worried About Charity fundraiser in the Last Year: Not on file  . Ran Out of Food in the Last Year: Not on file  Transportation Needs:   . Lack of Transportation (Medical): Not on file  . Lack of Transportation (Non-Medical): Not on file  Physical Activity:   . Days of Exercise per Week: Not on file  . Minutes of Exercise per Session: Not on file  Stress:   . Feeling of Stress : Not on file  Social Connections:   . Frequency of Communication with  Friends and Family: Not on file  . Frequency of Social Gatherings with Friends and Family: Not on file  . Attends Religious Services: Not on file  . Active Member of Clubs or Organizations: Not on file  . Attends Archivist Meetings: Not on file  . Marital Status: Not on file    Family History  Family history unknown: Yes        Review of Systems  Constitutional: Negative for fever, malaise/fatigue and weight loss.  HENT: Negative for congestion and sore throat.   Eyes:       Negative for visual changes  Respiratory: Negative for cough and shortness of breath.   Cardiovascular: Negative for chest pain, palpitations and leg swelling.  Gastrointestinal: Negative for blood in stool, constipation, diarrhea and heartburn.  Genitourinary: Negative for dysuria, frequency and urgency.  Musculoskeletal: Negative for falls, joint pain and myalgias.  Skin: Negative for rash.  Neurological: Negative for dizziness, sensory change and headaches.  Endo/Heme/Allergies: Does not bruise/bleed easily.  Psychiatric/Behavioral: Negative for depression, substance abuse and suicidal ideas. The patient is not nervous/anxious.     Objective:   Vitals:   12/31/19 0847  BP: 110/72  Pulse: 72  Temp: (!) 97 F (36.1 C)  SpO2: 99%    Body mass index is 29.37 kg/m.   Physical Examination:  Physical Exam Vitals reviewed.  Constitutional:      General: She is not in acute distress.    Appearance: She is well-developed.  HENT:     Right Ear: Tympanic membrane, ear canal and external ear normal.     Left Ear: Tympanic membrane, ear canal and external ear normal.  Eyes:     Extraocular Movements: Extraocular movements intact.     Conjunctiva/sclera: Conjunctivae normal.  Neck:     Thyroid: No thyroid mass, thyromegaly or thyroid tenderness.  Cardiovascular:     Rate and Rhythm: Normal rate and regular rhythm.     Pulses: Normal pulses.     Heart sounds: Normal heart sounds.    Pulmonary:     Effort: Pulmonary effort is normal. No respiratory distress.     Breath sounds: Normal breath sounds.  Chest:     Chest wall: No tenderness.  Abdominal:     General: Bowel sounds are normal.     Palpations: Abdomen is soft.  Genitourinary:    Comments: Deferred pelvic and breast exam to GYN Musculoskeletal:        General: Normal range of motion.     Cervical back: Normal range of motion and neck supple.     Right lower leg: No edema.     Left lower leg: No edema.  Lymphadenopathy:     Cervical: No cervical adenopathy.  Skin:    General: Skin is warm and dry.  Neurological:     Mental Status: She is alert and oriented to person, place, and time.     Deep Tendon Reflexes: Reflexes are normal and symmetric.  Psychiatric:        Mood and Affect: Mood normal.        Behavior: Behavior normal.        Thought Content: Thought content normal.    ASSESSMENT and PLAN: This visit occurred during the SARS-CoV-2 public health emergency.  Safety protocols were in place, including screening questions prior to the visit, additional usage of staff PPE, and extensive cleaning of exam room while observing appropriate contact time as indicated for disinfecting solutions.   Ailynn was seen today for annual exam.  Diagnoses and all orders for this visit:  Preventative health care -     CBC with Differential/Platelet -     Comprehensive metabolic panel -     Lipid panel  Acquired hypothyroidism -     T4, free -     TSH  Encounter for lipid screening for cardiovascular disease -     Lipid panel  Screening-pulmonary TB -     QuantiFERON-TB Gold Plus  Please have mammogram and colonoscopy results faxed to me. Send copy of school form through [employer]    Problem List Items Addressed This Visit      Endocrine   Acquired hypothyroidism    Stable weight, no hair loss, no palpitations Repeat TSH and T4, lipid panel. Maintain current dose while waiting for results       Relevant Orders   T4, free   TSH    Other Visit Diagnoses    Preventative health care    -  Primary   Relevant Orders   CBC with Differential/Platelet   Comprehensive metabolic panel   Lipid panel   Encounter for lipid screening for cardiovascular disease       Relevant Orders   Lipid panel   Screening-pulmonary TB       Relevant Orders   QuantiFERON-TB Gold Plus      Follow up: Return in about 1 year (around 12/30/2020) for CPE and hypothyroidism.  Wilfred Lacy, NP

## 2019-12-31 NOTE — Addendum Note (Signed)
Addended by: Wilfred Lacy L on: 12/31/2019 01:01 PM   Modules accepted: Orders

## 2020-01-02 LAB — QUANTIFERON-TB GOLD PLUS
Mitogen-NIL: 7.71 IU/mL
NIL: 1.61 IU/mL
QuantiFERON-TB Gold Plus: POSITIVE — AB
TB1-NIL: 5.29 IU/mL
TB2-NIL: 5.25 IU/mL

## 2020-01-04 NOTE — Addendum Note (Signed)
Addended by: Wilfred Lacy L on: 01/04/2020 09:07 AM   Modules accepted: Orders

## 2020-01-07 ENCOUNTER — Other Ambulatory Visit: Payer: BC Managed Care – PPO

## 2020-01-07 ENCOUNTER — Ambulatory Visit (INDEPENDENT_AMBULATORY_CARE_PROVIDER_SITE_OTHER): Payer: BC Managed Care – PPO

## 2020-01-07 ENCOUNTER — Other Ambulatory Visit: Payer: Self-pay

## 2020-01-07 DIAGNOSIS — Z111 Encounter for screening for respiratory tuberculosis: Secondary | ICD-10-CM

## 2020-01-07 DIAGNOSIS — R7612 Nonspecific reaction to cell mediated immunity measurement of gamma interferon antigen response without active tuberculosis: Secondary | ICD-10-CM | POA: Diagnosis not present

## 2020-01-24 DIAGNOSIS — R509 Fever, unspecified: Secondary | ICD-10-CM | POA: Diagnosis not present

## 2020-02-17 DIAGNOSIS — Z23 Encounter for immunization: Secondary | ICD-10-CM | POA: Diagnosis not present

## 2020-02-18 DIAGNOSIS — Z1231 Encounter for screening mammogram for malignant neoplasm of breast: Secondary | ICD-10-CM | POA: Diagnosis not present

## 2020-02-18 DIAGNOSIS — Z1239 Encounter for other screening for malignant neoplasm of breast: Secondary | ICD-10-CM | POA: Diagnosis not present

## 2020-02-18 LAB — HM MAMMOGRAPHY

## 2020-02-25 DIAGNOSIS — Z135 Encounter for screening for eye and ear disorders: Secondary | ICD-10-CM | POA: Diagnosis not present

## 2020-02-25 DIAGNOSIS — H524 Presbyopia: Secondary | ICD-10-CM | POA: Diagnosis not present

## 2020-06-09 ENCOUNTER — Telehealth: Payer: Self-pay | Admitting: Nurse Practitioner

## 2020-06-09 NOTE — Telephone Encounter (Signed)
Patient dropped off Medical Evaluation  form to be completed by her provider. Placed in providers folder in front office for pickup. Please call her at [phone] when it's ready.

## 2020-06-15 NOTE — Telephone Encounter (Signed)
Pt called back and said she was returning someones call but didn't know who

## 2020-06-16 NOTE — Telephone Encounter (Signed)
Left patient a voicemail informing her she needs an appointment for forms to be filled out.

## 2020-07-28 ENCOUNTER — Encounter: Payer: Self-pay | Admitting: Nurse Practitioner

## 2020-11-17 ENCOUNTER — Encounter: Payer: Self-pay | Admitting: Nurse Practitioner

## 2020-11-20 ENCOUNTER — Other Ambulatory Visit: Payer: Self-pay | Admitting: Nurse Practitioner

## 2020-11-20 DIAGNOSIS — E039 Hypothyroidism, unspecified: Secondary | ICD-10-CM

## 2021-01-05 ENCOUNTER — Other Ambulatory Visit: Payer: Self-pay

## 2021-01-05 ENCOUNTER — Ambulatory Visit (INDEPENDENT_AMBULATORY_CARE_PROVIDER_SITE_OTHER): Payer: 59 | Admitting: Nurse Practitioner

## 2021-01-05 ENCOUNTER — Encounter: Payer: Self-pay | Admitting: Nurse Practitioner

## 2021-01-05 VITALS — BP 112/60 | HR 66 | Temp 97.3°F | Ht 60.75 in | Wt 161.2 lb

## 2021-01-05 DIAGNOSIS — E039 Hypothyroidism, unspecified: Secondary | ICD-10-CM

## 2021-01-05 DIAGNOSIS — Z0001 Encounter for general adult medical examination with abnormal findings: Secondary | ICD-10-CM | POA: Diagnosis not present

## 2021-01-05 DIAGNOSIS — E7800 Pure hypercholesterolemia, unspecified: Secondary | ICD-10-CM | POA: Diagnosis not present

## 2021-01-05 DIAGNOSIS — Z23 Encounter for immunization: Secondary | ICD-10-CM

## 2021-01-05 DIAGNOSIS — E559 Vitamin D deficiency, unspecified: Secondary | ICD-10-CM | POA: Diagnosis not present

## 2021-01-05 NOTE — Progress Notes (Signed)
Subjective:    Patient ID: Sharon Barron, female    DOB: 06-Aug-1970, 50 y.o.   MRN: [id number]  Patient presents today for CPE and eval of chronic conditions  HPI Acquired hypothyroidism Repeat TSH and T4: Stable Maintain dose Refill sent  Pure hypercholesterolemia She continues daily exercise and DASH diet. No tobacco use ETOH consumption sparringly No HTN, no DM, no FHX of  premature CAD or PAD.  Repeat lipid panel today: elevated TC and LDL DASh diet and daily exercise recommended  Vitamin D deficiency Low at 49: 5000IU sent  Vision:up to date Dental:up to date Diet:heart healthy Exercise:walking Weight:  Wt Readings from Last 3 Encounters:  01/05/21 161 lb 3.2 oz (73.1 kg)  12/31/19 160 lb 9.6 oz (72.8 kg)  11/20/18 161 lb (73 kg)    Sexual History (orientation,birth control, marital status, STD):s/p hysterectomy, denies need for STD screen, will schedule repeat mammogram and colonoscopy  Depression/Suicide: Depression screen [facility] 2/9 01/05/2021 12/31/2019 11/20/2018 10/09/2018 09/10/2017 05/29/2017  Decreased Interest 0 0 0 0 0 0  Down, Depressed, Hopeless 0 0 0 0 0 0  PHQ - 2 Score 0 0 0 0 0 0   Immunizations: (TDAP, Hep C screen, Pneumovax, Influenza, zoster)  Health Maintenance  Topic Date Due   Colon Cancer Screening  Never done   COVID-19 Vaccine (4 - Booster for Pfizer series) 09/09/2020   Flu Shot  11/27/2020   Hepatitis C Screening: USPSTF Recommendation to screen - Ages 18-79 yo.  01/05/2022*   Zoster (Shingles) Vaccine (2 of 2) 03/02/2021   Tetanus Vaccine  08/27/2021   Mammogram  02/17/2022   HIV Screening  Completed   Pneumococcal Vaccination  Aged Out   HPV Vaccine  Aged Out  *Topic was postponed. The date shown is not the original due date.   Fall Risk: Fall Risk  11/20/2018 10/09/2018 09/10/2017 05/29/2017  Falls in the past year? 0 0 No No  Follow up Falls evaluation completed - - -   Medications and allergies reviewed with patient and updated if  appropriate.  Patient Active Problem List   Diagnosis Date Noted   Vitamin D deficiency 01/07/2021   Pure hypercholesterolemia 12/31/2019   S/P hysterectomy with oophorectomy 09/11/2017   Vasomotor symptoms due to menopause 09/10/2017   Keratoconjunctivitis sicca of both eyes not specified as Sjogren's 12/12/2016   Myopia of both eyes 12/12/2016   Acquired hypothyroidism 06/01/2015   No current outpatient medications on file prior to visit.   No current facility-administered medications on file prior to visit.   Past Medical History:  Diagnosis Date   Hypothyroidism    Past Surgical History:  Procedure Laterality Date   ABDOMINAL HYSTERECTOMY  2018   COLONOSCOPY     2015 (normal per patient)    Social History   Socioeconomic History   Marital status: Divorced    Spouse name: Not on file   Number of children: 1   Years of education: Not on file   Highest education level: Not on file  Occupational History   Not on file  Tobacco Use   Smoking status: Never   Smokeless tobacco: Never  Vaping Use   Vaping Use: Never used  Substance and Sexual Activity   Alcohol use: No   Drug use: No   Sexual activity: Yes    Birth control/protection: Surgical  Other Topics Concern   Not on file  Social History Narrative   Not on file   Social Determinants of Health  Financial Resource Strain: Not on file  Food Insecurity: Not on file  Transportation Needs: Not on file  Physical Activity: Not on file  Stress: Not on file  Social Connections: Not on file   Family History  Family history unknown: Yes       Review of Systems  Constitutional:  Negative for fever, malaise/fatigue and weight loss.  HENT:  Negative for congestion and sore throat.   Eyes:        Negative for visual changes  Respiratory:  Negative for cough and shortness of breath.   Cardiovascular:  Negative for chest pain, palpitations and leg swelling.  Gastrointestinal:  Negative for blood in stool,  constipation, diarrhea and heartburn.  Genitourinary:  Negative for dysuria, frequency and urgency.  Musculoskeletal:  Negative for falls, joint pain and myalgias.  Skin:  Negative for rash.  Neurological:  Negative for dizziness, sensory change and headaches.  Endo/Heme/Allergies:  Does not bruise/bleed easily.  Psychiatric/Behavioral:  Negative for depression, hallucinations, memory loss, substance abuse and suicidal ideas. The patient is not nervous/anxious and does not have insomnia.    Objective:   Vitals:   01/05/21 1329  BP: 112/60  Pulse: 66  Temp: (!) 97.3 F (36.3 C)  SpO2: 100%   Body mass index is 30.71 kg/m.  Physical Examination:  Physical Exam Vitals reviewed.  Constitutional:      General: She is not in acute distress.    Appearance: She is well-developed.  HENT:     Right Ear: Tympanic membrane, ear canal and external ear normal.     Left Ear: Tympanic membrane, ear canal and external ear normal.  Eyes:     Extraocular Movements: Extraocular movements intact.     Conjunctiva/sclera: Conjunctivae normal.  Cardiovascular:     Rate and Rhythm: Normal rate and regular rhythm.     Pulses: Normal pulses.     Heart sounds: Normal heart sounds.  Pulmonary:     Effort: Pulmonary effort is normal. No respiratory distress.     Breath sounds: Normal breath sounds.  Chest:     Chest wall: No tenderness.  Abdominal:     General: Bowel sounds are normal.     Palpations: Abdomen is soft.  Musculoskeletal:        General: Normal range of motion.     Cervical back: Normal range of motion and neck supple.     Right lower leg: No edema.     Left lower leg: No edema.  Skin:    General: Skin is warm and dry.  Neurological:     Mental Status: She is alert and oriented to person, place, and time.     Deep Tendon Reflexes: Reflexes are normal and symmetric.  Psychiatric:        Mood and Affect: Mood normal.        Behavior: Behavior normal.        Thought Content:  Thought content normal.    ASSESSMENT and PLAN: This visit occurred during the SARS-CoV-2 public health emergency.  Safety protocols were in place, including screening questions prior to the visit, additional usage of staff PPE, and extensive cleaning of exam room while observing appropriate contact time as indicated for disinfecting solutions.   Rami was seen today for annual exam.  Diagnoses and all orders for this visit:  Encounter for preventative adult health care exam with abnormal findings -     Cancel: Comprehensive metabolic panel -     Cancel: CBC with Differential/Platelet -  CBC with Differential/Platelet -     Comprehensive metabolic panel  Pure hypercholesterolemia -     Cancel: Lipid panel -     Lipid panel  Acquired hypothyroidism -     Cancel: TSH -     Cancel: T4, free -     T4, free -     TSH  Vitamin D deficiency -     Cancel: Vitamin D (25 hydroxy) -     Vitamin D (25 hydroxy) -     Cholecalciferol (VITAMIN D3) 125 MCG (5000 UT) CAPS; Take 1 capsule (5,000 Units total) by mouth daily.  Need for shingles vaccine -     Varicella-zoster vaccine IM  Hypothyroidism (acquired) -     levothyroxine (EUTHYROX) 50 MCG tablet; Take 1 tablet (50 mcg total) by mouth daily before breakfast.  Schedule appt with GI for repeat colonoscopy and for mammogram Have reports faxed to me once completed. Go to lab for blood draw.     Problem List Items Addressed This Visit       Endocrine   Acquired hypothyroidism    Repeat TSH and T4: Stable Maintain dose Refill sent      Relevant Medications   levothyroxine (EUTHYROX) 50 MCG tablet   Other Relevant Orders   T4, free (Completed)   TSH (Completed)     Other   Pure hypercholesterolemia    She continues daily exercise and DASH diet. No tobacco use ETOH consumption sparringly No HTN, no DM, no FHX of  premature CAD or PAD.  Repeat lipid panel today: elevated TC and LDL DASh diet and daily exercise  recommended      Relevant Orders   Lipid panel (Completed)   Vitamin D deficiency    Low at 49: 5000IU sent      Relevant Medications   Cholecalciferol (VITAMIN D3) 125 MCG (5000 UT) CAPS   Other Relevant Orders   Vitamin D (25 hydroxy) (Completed)   Other Visit Diagnoses     Encounter for preventative adult health care exam with abnormal findings    -  Primary   Relevant Orders   CBC with Differential/Platelet (Completed)   Comprehensive metabolic panel (Completed)   Need for shingles vaccine       Relevant Orders   Varicella-zoster vaccine IM (Completed)   Hypothyroidism (acquired)       Relevant Medications   levothyroxine (EUTHYROX) 50 MCG tablet       Follow up: Return in about 1 year (around 01/05/2022) for CPE (fasting).  Wilfred Lacy, NP

## 2021-01-05 NOTE — Assessment & Plan Note (Addendum)
She continues daily exercise and DASH diet. No tobacco use ETOH consumption sparringly No HTN, no DM, no FHX of  premature CAD or PAD.  Repeat lipid panel today: elevated TC and LDL DASh diet and daily exercise recommended

## 2021-01-05 NOTE — Assessment & Plan Note (Addendum)
Repeat TSH and T4: Stable Maintain dose Refill sent

## 2021-01-05 NOTE — Patient Instructions (Signed)
Schedule appt with GI and for mammogram Have reports faxed to me once completed.  Go to lab for blood draw.  Preventive Care 24-52 Years Old, Female Preventive care refers to lifestyle choices and visits with your health care provider that can promote health and wellness. This includes: A yearly physical exam. This is also called an annual wellness visit. Regular dental and eye exams. Immunizations. Screening for certain conditions. Healthy lifestyle choices, such as: Eating a healthy diet. Getting regular exercise. Not using drugs or products that contain nicotine and tobacco. Limiting alcohol use. What can I expect for my preventive care visit? Physical exam Your health care provider will check your: Height and weight. These may be used to calculate your BMI (body mass index). BMI is a measurement that tells if you are at a healthy weight. Heart rate and blood pressure. Body temperature. Skin for abnormal spots. Counseling Your health care provider may ask you questions about your: Past medical problems. Family's medical history. Alcohol, tobacco, and drug use. Emotional well-being. Home life and relationship well-being. Sexual activity. Diet, exercise, and sleep habits. Work and work Statistician. Access to firearms. Method of birth control. Menstrual cycle. Pregnancy history. What immunizations do I need? Vaccines are usually given at various ages, according to a schedule. Your health care provider will recommend vaccines for you based on your age, medical history, and lifestyle or other factors, such as travel or where you work. What tests do I need? Blood tests Lipid and cholesterol levels. These may be checked every 5 years, or more often if you are over 62 years old. Hepatitis C test. Hepatitis B test. Screening Lung cancer screening. You may have this screening every year starting at age 75 if you have a 30-pack-year history of smoking and currently smoke or have  quit within the past 15 years. Colorectal cancer screening. All adults should have this screening starting at age 31 and continuing until age 54. Your health care provider may recommend screening at age 62 if you are at increased risk. You will have tests every 1-10 years, depending on your results and the type of screening test. Diabetes screening. This is done by checking your blood sugar (glucose) after you have not eaten for a while (fasting). You may have this done every 1-3 years. Mammogram. This may be done every 1-2 years. Talk with your health care provider about when you should start having regular mammograms. This may depend on whether you have a family history of breast cancer. BRCA-related cancer screening. This may be done if you have a family history of breast, ovarian, tubal, or peritoneal cancers. Pelvic exam and Pap test. This may be done every 3 years starting at age 2. Starting at age 42, this may be done every 5 years if you have a Pap test in combination with an HPV test. Other tests STD (sexually transmitted disease) testing, if you are at risk. Bone density scan. This is done to screen for osteoporosis. You may have this scan if you are at high risk for osteoporosis. Talk with your health care provider about your test results, treatment options, and if necessary, the need for more tests. Follow these instructions at home: Eating and drinking  Eat a diet that includes fresh fruits and vegetables, whole grains, lean protein, and low-fat dairy products. Take vitamin and mineral supplements as recommended by your health care provider. Do not drink alcohol if: Your health care provider tells you not to drink. You are pregnant, may be  pregnant, or are planning to become pregnant. If you drink alcohol: Limit how much you have to 0-1 drink a day. Be aware of how much alcohol is in your drink. In the [place] one drink equals one 12 oz bottle of beer (355 mL), one 5 oz glass  of wine (148 mL), or one 1 oz glass of hard liquor (44 mL). Lifestyle Take daily care of your teeth and gums. Brush your teeth every morning and night with fluoride toothpaste. Floss one time each day. Stay active. Exercise for at least 30 minutes 5 or more days each week. Do not use any products that contain nicotine or tobacco, such as cigarettes, e-cigarettes, and chewing tobacco. If you need help quitting, ask your health care provider. Do not use drugs. If you are sexually active, practice safe sex. Use a condom or other form of protection to prevent STIs (sexually transmitted infections). If you do not wish to become pregnant, use a form of birth control. If you plan to become pregnant, see your health care provider for a prepregnancy visit. If told by your health care provider, take low-dose aspirin daily starting at age 1. Find healthy ways to cope with stress, such as: Meditation, yoga, or listening to music. Journaling. Talking to a trusted person. Spending time with friends and family. Safety Always wear your seat belt while driving or riding in a vehicle. Do not drive: If you have been drinking alcohol. Do not ride with someone who has been drinking. When you are tired or distracted. While texting. Wear a helmet and other protective equipment during sports activities. If you have firearms in your house, make sure you follow all gun safety procedures. What's next? Visit your health care provider once a year for an annual wellness visit. Ask your health care provider how often you should have your eyes and teeth checked. Stay up to date on all vaccines. This information is not intended to replace advice given to you by your health care provider. Make sure you discuss any questions you have with your health care provider. Document Revised: 06/23/2020 Document Reviewed: 12/25/2017 Elsevier Patient Education  2022 [employer].

## 2021-01-06 LAB — CBC WITH DIFFERENTIAL/PLATELET
Absolute Monocytes: 363 cells/uL (ref 200–950)
Basophils Absolute: 18 cells/uL (ref 0–200)
Basophils Relative: 0.4 %
Eosinophils Absolute: 32 cells/uL (ref 15–500)
Eosinophils Relative: 0.7 %
HCT: 43 % (ref 35.0–45.0)
Hemoglobin: 13.8 g/dL (ref 11.7–15.5)
Lymphs Abs: 2447 cells/uL (ref 850–3900)
MCH: 28.2 pg (ref 27.0–33.0)
MCHC: 32.1 g/dL (ref 32.0–36.0)
MCV: 87.9 fL (ref 80.0–100.0)
MPV: 10.5 fL (ref 7.5–12.5)
Monocytes Relative: 7.9 %
Neutro Abs: 1739 cells/uL (ref 1500–7800)
Neutrophils Relative %: 37.8 %
Platelets: 316 10*3/uL (ref 140–400)
RBC: 4.89 10*6/uL (ref 3.80–5.10)
RDW: 12.7 % (ref 11.0–15.0)
Total Lymphocyte: 53.2 %
WBC: 4.6 10*3/uL (ref 3.8–10.8)

## 2021-01-06 LAB — T4, FREE: Free T4: 1.2 ng/dL (ref 0.8–1.8)

## 2021-01-06 LAB — LIPID PANEL
Cholesterol: 202 mg/dL — ABNORMAL HIGH (ref <200)
HDL: 54 mg/dL (ref >=50)
LDL Cholesterol (Calc): 132 mg/dL (calc) — ABNORMAL HIGH
Non-HDL Cholesterol (Calc): 148 mg/dL (calc) — ABNORMAL HIGH (ref <130)
Total CHOL/HDL Ratio: 3.7 (calc) (ref <5.0)
Triglycerides: 69 mg/dL (ref <150)

## 2021-01-06 LAB — COMPREHENSIVE METABOLIC PANEL
AG Ratio: 1.7 (calc) (ref 1.0–2.5)
ALT: 17 U/L (ref 6–29)
AST: 20 U/L (ref 10–35)
Albumin: 4.3 g/dL (ref 3.6–5.1)
Alkaline phosphatase (APISO): 70 U/L (ref 37–153)
BUN: 10 mg/dL (ref 7–25)
CO2: 25 mmol/L (ref 20–32)
Calcium: 9.8 mg/dL (ref 8.6–10.4)
Chloride: 105 mmol/L (ref 98–110)
Creat: 0.78 mg/dL (ref 0.50–1.03)
Globulin: 2.5 g/dL (calc) (ref 1.9–3.7)
Glucose, Bld: 79 mg/dL (ref 65–99)
Potassium: 4 mmol/L (ref 3.5–5.3)
Sodium: 141 mmol/L (ref 135–146)
Total Bilirubin: 0.5 mg/dL (ref 0.2–1.2)
Total Protein: 6.8 g/dL (ref 6.1–8.1)

## 2021-01-06 LAB — TSH: TSH: 0.44 mIU/L

## 2021-01-06 LAB — VITAMIN D 25 HYDROXY (VIT D DEFICIENCY, FRACTURES): Vit D, 25-Hydroxy: 49 ng/mL (ref 30–100)

## 2021-01-07 DIAGNOSIS — E559 Vitamin D deficiency, unspecified: Secondary | ICD-10-CM | POA: Insufficient documentation

## 2021-01-07 MED ORDER — VITAMIN D3 125 MCG (5000 UT) PO CAPS: 5000.0000 [IU] | ORAL_CAPSULE | Freq: Every day | ORAL | 3 refills | Status: AC

## 2021-01-07 MED ORDER — LEVOTHYROXINE SODIUM 50 MCG PO TABS: 50.0000 ug | ORAL_TABLET | Freq: Every day | ORAL | 3 refills | Status: DC

## 2021-01-07 NOTE — Assessment & Plan Note (Signed)
Low at 49: 5000IU sent

## 2021-03-02 LAB — HM MAMMOGRAPHY

## 2021-03-20 ENCOUNTER — Ambulatory Visit: Payer: 59

## 2021-06-25 ENCOUNTER — Ambulatory Visit: Payer: 59 | Admitting: Nurse Practitioner

## 2021-06-25 ENCOUNTER — Ambulatory Visit (HOSPITAL_BASED_OUTPATIENT_CLINIC_OR_DEPARTMENT_OTHER): Admission: RE | Admit: 2021-06-25 | Payer: 59 | Source: Ambulatory Visit

## 2021-06-25 ENCOUNTER — Encounter: Payer: Self-pay | Admitting: Nurse Practitioner

## 2021-06-25 ENCOUNTER — Other Ambulatory Visit: Payer: Self-pay

## 2021-06-25 ENCOUNTER — Ambulatory Visit (HOSPITAL_BASED_OUTPATIENT_CLINIC_OR_DEPARTMENT_OTHER)
Admission: RE | Admit: 2021-06-25 | Discharge: 2021-06-25 | Disposition: A | Discharge status: Home / Self Care | Payer: 59 | Source: Ambulatory Visit | Attending: Nurse Practitioner | Admitting: Nurse Practitioner

## 2021-06-25 VITALS — BP 122/80 | HR 78 | Temp 97.1°F | Wt 168.6 lb

## 2021-06-25 DIAGNOSIS — E0789 Other specified disorders of thyroid: Secondary | ICD-10-CM

## 2021-06-25 DIAGNOSIS — R6883 Chills (without fever): Secondary | ICD-10-CM | POA: Diagnosis not present

## 2021-06-25 DIAGNOSIS — E060 Acute thyroiditis: Secondary | ICD-10-CM | POA: Diagnosis not present

## 2021-06-25 DIAGNOSIS — E049 Nontoxic goiter, unspecified: Secondary | ICD-10-CM

## 2021-06-25 LAB — CBC WITH DIFFERENTIAL/PLATELET
Basophils Absolute: 0 10*3/uL (ref 0.0–0.1)
Basophils Relative: 0.5 % (ref 0.0–3.0)
Eosinophils Absolute: 0 10*3/uL (ref 0.0–0.7)
Eosinophils Relative: 0.5 % (ref 0.0–5.0)
HCT: 36.5 % (ref 36.0–46.0)
Hemoglobin: 12.1 g/dL (ref 12.0–15.0)
Lymphocytes Relative: 31.4 % (ref 12.0–46.0)
Lymphs Abs: 1.9 10*3/uL (ref 0.7–4.0)
MCHC: 33.1 g/dL (ref 30.0–36.0)
MCV: 83.6 fl (ref 78.0–100.0)
Monocytes Absolute: 0.5 10*3/uL (ref 0.1–1.0)
Monocytes Relative: 7.7 % (ref 3.0–12.0)
Neutro Abs: 3.7 10*3/uL (ref 1.4–7.7)
Neutrophils Relative %: 59.9 % (ref 43.0–77.0)
Platelets: 396 10*3/uL (ref 150.0–400.0)
RBC: 4.37 Mil/uL (ref 3.87–5.11)
RDW: 13.2 % (ref 11.5–15.5)
WBC: 6.2 10*3/uL (ref 4.0–10.5)

## 2021-06-25 LAB — C-REACTIVE PROTEIN: CRP: 3.1 mg/dL (ref 0.5–20.0)

## 2021-06-25 NOTE — Patient Instructions (Signed)
Go to lab for blood draw You will be contacted to schedule appt for thyroid US  Goiter A goiter is an enlarged thyroid gland. The thyroid is located in the lower front of the neck. It makes hormones that affect many body parts and systems, including the system that affects how quickly the body burns fuel for energy (metabolism). Most goiters are painless and are not a cause for concern. Some goiters can affect the way your thyroid makes thyroid hormones. Goiters and conditions that cause goiters can be treated, if necessary. What are the causes? Common causes of this condition include: Lack (deficiency) of a mineral called iodine. The thyroid gland uses iodine to make thyroid hormones. Diseases that attack healthy cells in the body (autoimmune diseases) and affect thyroid function, such as Graves' disease or Hashimoto's disease. These diseases may cause the body to produce too much thyroid hormone (hyperthyroidism) or too little of the hormone (hypothyroidism). Conditions that cause inflammation of the thyroid (thyroiditis). One or more small growths on the thyroid (nodular goiter). Other causes include: Medical problems caused by abnormal genes that are passed from parent to child (genetic defects). Thyroid injury or infection. Tumors that may or may not be cancerous. Pregnancy. Certain medicines. Exposure to radiation. In some cases, the cause may not be known. What increases the risk? This condition is more likely to develop in: People who do not get enough iodine in their diet. People who have a family history of goiter. Women. People who are older than age 43. People who smoke tobacco. People who have had exposure to radiation. What are the signs or symptoms? The main symptom of this condition is swelling in the lower, front part of the neck. This swelling can range from a very small bump to a large lump. Other symptoms may include: A tight feeling in the throat. A hoarse  voice. Coughing. Wheezing. Difficulty swallowing or breathing. Bulging veins in the neck. Dizziness. When a goiter is the result of an overactive thyroid (hyperthyroidism), symptoms may also include: Nervousness or restlessness. Inability to tolerate heat. Unexplained weight loss. Diarrhea. Change in the texture of hair or skin. Changes in heartbeat, such as skipped beats, extra beats, or a rapid heart rate. Loss of menstruation. Shaky hands. Increased appetite. Sleep problems. When a goiter is the result of an underactive thyroid (hypothyroidism), symptoms may also include: Feeling like you have no energy (lethargy). Inability to tolerate cold. Weight gain that is not explained by a change in diet or exercise habits. Dry skin. Coarse hair. Irregular menstrual periods. Constipation. Sadness or depression. Fatigue. In some cases, there may not be any symptoms and the thyroid hormone levels may be normal. How is this diagnosed? This condition may be diagnosed based on your symptoms, your medical history, and a physical exam. You may have tests, such as: Blood tests to check thyroid function. Imaging tests, such as: Ultrasound. CT scan. MRI. Thyroid scan. Removal of a tissue sample (biopsy) of the goiter or any nodules. The sample will be tested to check for cancer. How is this treated? Treatment for this condition depends on the cause and your symptoms. Treatment may include: Medicines to regulate thyroid hormone levels. Anti-inflammatory medicines or steroid medicines, if the goiter is caused by inflammation. Iodine supplements or changes to your diet, if the goiter is caused by iodine deficiency. Radioactive iodine treatment. Surgery to remove your thyroid. In some cases, you may only need regular check-ups with your health care provider to monitor your condition, and  you may not need treatment. Follow these instructions at home: Follow instructions from your health care  provider about any changes to your diet. Take over-the-counter and prescription medicines only as told by your health care provider. These include supplements. Do not use any products that contain nicotine or tobacco, such as cigarettes and e-cigarettes. If you need help quitting, ask your health care provider. Keep all follow-up visits as told by your health care provider. This is important. Contact a health care provider if: Your symptoms do not get better with treatment. You have nausea, vomiting, or diarrhea. Get help right away if: You have sudden, unexplained confusion or other mental changes. You have a fever. You have chest pain. You have trouble breathing or swallowing. You suddenly become very weak. You experience extreme restlessness. You feel your heart racing. Summary A goiter is an enlarged thyroid gland. The thyroid gland is located in the lower front of the neck. It makes hormones that affect many body parts and systems, including the system that affects how quickly the body burns fuel for energy (metabolism). The main symptom of this condition is swelling in the lower, front part of the neck. This swelling can range from a very small bump to a large lump. Treatment for this condition depends on the cause and your symptoms. You may need medicines, supplements, or regular monitoring of your condition. This information is not intended to replace advice given to you by your health care provider. Make sure you discuss any questions you have with your health care provider. Document Revised: 12/23/2019 Document Reviewed: 12/23/2019 Elsevier Patient Education  [place].

## 2021-06-25 NOTE — Progress Notes (Addendum)
Subjective:  Patient ID: Sharon Barron, female    DOB: 06-16-1970  Age: 51 y.o. MRN: [id number]  CC: Acute Visit (Pt c/o neck pain x 2 weeks, states pain is better with ibuprofen. /Denies any known injury. )  Neck Pain  This is a new problem. The current episode started 1 to 4 weeks ago. The problem occurs constantly. The problem has been unchanged. The pain is associated with nothing. The pain is present in the anterior neck and left side. The quality of the pain is described as aching. The pain is moderate. The symptoms are aggravated by swallowing. The pain is Same all the time. Associated symptoms include pain with swallowing. Associated symptoms comments: Chills, radiates to left ear and left temple. No pain with chewing. She has tried NSAIDs for the symptoms. The treatment provided mild relief.  Hx of hypothyroidism. No previous thyroid US. Reports she is compliant with levothyroxine dose Denies any recent viral illness  Wt Readings from Last 3 Encounters:  06/25/21 168 lb 9.6 oz (76.5 kg)  01/05/21 161 lb 3.2 oz (73.1 kg)  12/31/19 160 lb 9.6 oz (72.8 kg)    BP Readings from Last 3 Encounters:  06/25/21 122/80  01/05/21 112/60  12/31/19 110/72    Reviewed past Medical, Social and Family history today.  Outpatient Medications Prior to Visit  Medication Sig Dispense Refill   Cholecalciferol (VITAMIN D3) 125 MCG (5000 UT) CAPS Take 1 capsule (5,000 Units total) by mouth daily. 90 capsule 3   levothyroxine (EUTHYROX) 50 MCG tablet Take 1 tablet (50 mcg total) by mouth daily before breakfast. 90 tablet 3   No facility-administered medications prior to visit.   ROS See HPI  Objective:  BP 122/80 (BP Location: Left Arm, Patient Position: Sitting, Cuff Size: Normal)    Pulse 78    Temp (!) 97.1 F (36.2 C) (Temporal)    Wt 168 lb 9.6 oz (76.5 kg)    SpO2 99%    BMI 32.12 kg/m   Physical Exam Constitutional:      General: She is not in acute distress. HENT:     Left Ear:  Tympanic membrane, ear canal and external ear normal.     Mouth/Throat:     Mouth: Mucous membranes are moist.     Tongue: No lesions. Tongue does not deviate from midline.     Palate: No mass and lesions.     Pharynx: Oropharynx is clear. Uvula midline.     Tonsils: No tonsillar exudate.  Neck:     Thyroid: Thyroid mass, thyromegaly and thyroid tenderness present.  Cardiovascular:     Rate and Rhythm: Normal rate.     Pulses: Normal pulses.  Pulmonary:     Effort: Pulmonary effort is normal.  Musculoskeletal:     Cervical back: Normal range of motion and neck supple. No tenderness or crepitus. No pain with movement.  Lymphadenopathy:     Cervical: No cervical adenopathy.  Skin:    Findings: No rash.  Neurological:     Mental Status: She is alert and oriented to person, place, and time.  Psychiatric:        Mood and Affect: Mood normal.        Behavior: Behavior normal.        Thought Content: Thought content normal.   Assessment & Plan:  This visit occurred during the SARS-CoV-2 public health emergency.  Safety protocols were in place, including screening questions prior to the visit, additional usage of staff  PPE, and extensive cleaning of exam room while observing appropriate contact time as indicated for disinfecting solutions.   [name] was seen today for acute visit.  Diagnoses and all orders for this visit:  Enlarged thyroid -     CBC with Differential/Platelet -     C-reactive protein -     TSH -     T4, free -     Cancel: US THYROID; Future -     Thyroid peroxidase antibody -     US THYROID; Future -     US THYROID; Future  Acute thyroiditis -     CBC with Differential/Platelet -     C-reactive protein -     TSH -     T4, free -     Cancel: US THYROID; Future -     Thyroid peroxidase antibody -     US THYROID; Future -     US THYROID; Future  Chills (without fever) -     CBC with Differential/Platelet -     C-reactive protein -     TSH -     T4, free -      Cancel: US THYROID; Future -     US THYROID; Future -     US THYROID; Future  Possible acute thyroiditis? Normal cbc and CRP Decrease in TSH and T4 Positive antithyroid antibody Entered endocrinology referral. Pending thyroid US  Problem List Items Addressed This Visit   None Visit Diagnoses     Enlarged thyroid    -  Primary   Relevant Orders   CBC with Differential/Platelet (Completed)   C-reactive protein (Completed)   TSH (Completed)   T4, free (Completed)   Thyroid peroxidase antibody (Completed)   US THYROID   US THYROID   Acute thyroiditis       Relevant Orders   CBC with Differential/Platelet (Completed)   C-reactive protein (Completed)   TSH (Completed)   T4, free (Completed)   Thyroid peroxidase antibody (Completed)   US THYROID   US THYROID   Chills (without fever)       Relevant Orders   CBC with Differential/Platelet (Completed)   C-reactive protein (Completed)   TSH (Completed)   T4, free (Completed)   US THYROID   US THYROID       Follow-up: Return if symptoms worsen or fail to improve.  Wilfred Lacy, NP

## 2021-06-26 LAB — T4, FREE: Free T4: 0.91 ng/dL (ref 0.60–1.60)

## 2021-06-26 LAB — THYROID PEROXIDASE ANTIBODY: Thyroperoxidase Ab SerPl-aCnc: 23 IU/mL — ABNORMAL HIGH (ref <9)

## 2021-06-26 LAB — TSH: TSH: 0.34 u[IU]/mL — ABNORMAL LOW (ref 0.35–5.50)

## 2021-06-27 NOTE — Addendum Note (Signed)
Addended by: Wilfred Lacy L on: 06/27/2021 09:07 AM   Modules accepted: Orders

## 2021-06-29 ENCOUNTER — Other Ambulatory Visit: Payer: Self-pay | Admitting: Nurse Practitioner

## 2021-06-29 DIAGNOSIS — E060 Acute thyroiditis: Secondary | ICD-10-CM

## 2021-06-29 DIAGNOSIS — E039 Hypothyroidism, unspecified: Secondary | ICD-10-CM

## 2021-08-09 NOTE — Progress Notes (Signed)
? ? ?Name: Sharon Barron  ?MRN/ DOB: [id number], 1970/12/13    ?Age/ Sex: 51 y.o., female   ? ?PCP: Flossie Buffy, NP   ?Reason for Endocrinology Evaluation: Thyromegaly  ?   ?Date of Initial Endocrinology Evaluation: 08/10/2021   ? ? ?HPI: ?Sharon Barron is a 51 y.o. female with a past medical history of hypothyroidism. The patient presented for initial endocrinology clinic visit on 08/10/2021 for consultative assistance with her thyromegaly.  ? ?She has been diagnosed with hypothyroidism in 2008 . She was taking it regularly until 05/2021 Levothyroxine was discontinued due to low TSH .  At the time she did endorse anxiety, palpitations and overwhelming feeling , she was also noted with thyroid enlargement at the time with tenderness and pain radiating to her teeth and ears. ? ?Patient did have a viral infection~a week prior to her presentation to PCPs office ?Thyroid ultrasound revealed subcentimeter nodules ? ?  ? ? ?Today she denies palpitations  ?She denies weight loss  ?Denies diarrhea or loose stools  ?Anxiety has improved  ?Neck pain resolved  ?No biotin intake  ?No prior exposure to radiation  ? ? ?S/P hystrectomy in 2018 ? ?No Fh of thyroid disease  ? ? ?HISTORY:  ?Past Medical History:  ?Past Medical History:  ?Diagnosis Date  ? Hypothyroidism   ? ?Past Surgical History:  ?Past Surgical History:  ?Procedure Laterality Date  ? ABDOMINAL HYSTERECTOMY  2018  ? COLONOSCOPY    ? 2015 (normal per patient)  ?  ?Social History:  reports that she has never smoked. She has never used smokeless tobacco. She reports that she does not drink alcohol and does not use drugs. ?Family History: Family history is unknown by patient. ? ? ?HOME MEDICATIONS: ?Allergies as of 08/10/2021   ?No Known Allergies ?  ? ?  ?Medication List  ?  ? ?  ? Accurate as of August 10, 2021  6:30 PM. If you have any questions, ask your nurse or doctor.  ?  ?  ? ?  ? ?levothyroxine 50 MCG tablet ?Commonly known as: SYNTHROID ?Take 1 tablet (50 mcg  total) by mouth daily. ?What changed: when to take this ?Changed by: Dorita Sciara, MD ?  ?Vitamin D3 125 MCG (5000 UT) Caps ?Take 1 capsule (5,000 Units total) by mouth daily. ?  ? ?  ?  ? ? ?REVIEW OF SYSTEMS: ?A comprehensive ROS was conducted with the patient and is negative except as per HPI  ? ? ?OBJECTIVE:  ?VS: BP 132/88 (BP Location: Left Arm, Patient Position: Sitting, Cuff Size: Normal)   Pulse 75   Ht 5' 0.75" (1.543 m)   Wt 166 lb 3.2 oz (75.4 kg)   SpO2 99%   BMI 31.66 kg/m?   ? ?Wt Readings from Last 3 Encounters:  ?08/10/21 166 lb 3.2 oz (75.4 kg)  ?06/25/21 168 lb 9.6 oz (76.5 kg)  ?01/05/21 161 lb 3.2 oz (73.1 kg)  ? ? ?EXAM: ?General: Pt appears well and is in NAD  ?Neck: General: Supple without adenopathy. ?Thyroid: Thyroid size normal.  No goiter or nodules appreciated. No thyroid bruit.  ?Lungs: Clear with good BS bilat with no rales, rhonchi, or wheezes  ?Heart: Auscultation: RRR.  ?Abdomen: Normoactive bowel sounds, soft, nontender, without masses or organomegaly palpable  ?Extremities:  ?BL LE: No pretibial edema normal ROM and strength.  ?Mental Status: Judgment, insight: Intact ?Orientation: Oriented to time, place, and person ?Mood and affect: No depression, anxiety,  or agitation  ? ? ? ?DATA REVIEWED: ? ? Latest Reference Range & Units 06/25/21 13:58 08/10/21 12:45  ?TSH 0.35 - 5.50 uIU/mL 0.34 (L) 59.78 (H)  ?T4,Free(Direct) 0.60 - 1.60 ng/dL 0.91 0.35 (L)  ?Thyroperoxidase Ab SerPl-aCnc <9 IU/mL 23 (H)   ? ? ?Thyroid ultrasound 06/28/2021 ? ?0.2 cm dystrophic calcification within the right mid to lower pole ?0.5 cm anechoic left upper pole nodule ?0.5 cm left midpole and 0.4 cm left lower pole hypoechoic nodule ?No significant cervical lymphadenopathy ? ?ASSESSMENT/PLAN/RECOMMENDATIONS:  ? ?Hashimoto's thyroiditis: ? ? ?-It appears that the patient had an episode of subacute thyroiditis triggered by viral infection which resulted in low TSH.  This has resolved.  Levothyroxine  was discontinued by her PCP for short period of time but the patient opted to hold off on restarting levothyroxine until her visit today ?-Patient is clinically euthyroid at this time ?-No local neck symptoms ?- Pt educated extensively on the correct way to take levothyroxine (first thing in the morning with water, 30 minutes before eating or taking other medications). ?- Pt encouraged to double dose the following day if she were to miss a dose given long half-life of levothyroxine. ?-TSH elevated at 59.78 u IU/mL, patient will be started on levothyroxine again ?-She would is advised to have her TSH rechecked in 6 weeks at [facility] ? ? ? ?Medications : ?Restart levothyroxine 50 mcg daily ? ? ?Patient to have labs in 6 weeks at [facility] ?Patient will be moving out of town ? ? ? ?Addendum: Discussed lab results with the patient on 08/10/2021 at approximately 1700 ?Signed electronically by: ?Abby Nena Jordan, MD ? ?[facility]  ?[facility] ?[address] ?[place], [place] [address] ?Phone: [phone] ?FAX: [phone] ? ? ?CC: ?Nche, Charlene Brooke, NP ?[place] [address] ?Phone: [phone] ?Fax: [phone] ? ? ?Return to Endocrinology clinic as below: ?No future appointments. ?  ? ? ? ? ? ?

## 2021-08-10 ENCOUNTER — Encounter: Payer: Self-pay | Admitting: Internal Medicine

## 2021-08-10 ENCOUNTER — Ambulatory Visit: Payer: 59 | Admitting: Internal Medicine

## 2021-08-10 VITALS — BP 132/88 | HR 75 | Ht 60.75 in | Wt 166.2 lb

## 2021-08-10 DIAGNOSIS — E039 Hypothyroidism, unspecified: Secondary | ICD-10-CM | POA: Diagnosis not present

## 2021-08-10 LAB — T4, FREE: Free T4: 0.35 ng/dL — ABNORMAL LOW (ref 0.60–1.60)

## 2021-08-10 LAB — TSH: TSH: 59.78 u[IU]/mL — ABNORMAL HIGH (ref 0.35–5.50)

## 2021-08-10 MED ORDER — LEVOTHYROXINE SODIUM 50 MCG PO TABS: 50.0000 ug | ORAL_TABLET | Freq: Every day | ORAL | 3 refills | Status: AC

## 2021-08-10 NOTE — Patient Instructions (Signed)

## 2021-08-13 LAB — THYROID PEROXIDASE ANTIBODY: Thyroperoxidase Ab SerPl-aCnc: 60 IU/mL — ABNORMAL HIGH (ref <9)

## 2021-08-13 LAB — T3: T3, Total: 56 ng/dL — ABNORMAL LOW (ref 76–181)

## 2022-04-06 IMAGING — DX DG CHEST 2V
2 series · 2 of 2 positions shown · non-contrast
Comparison: Radiograph 09/19/2006

CLINICAL DATA: Positive QuantiFERON

EXAM:
CHEST - 2 VIEW

[chest pa]
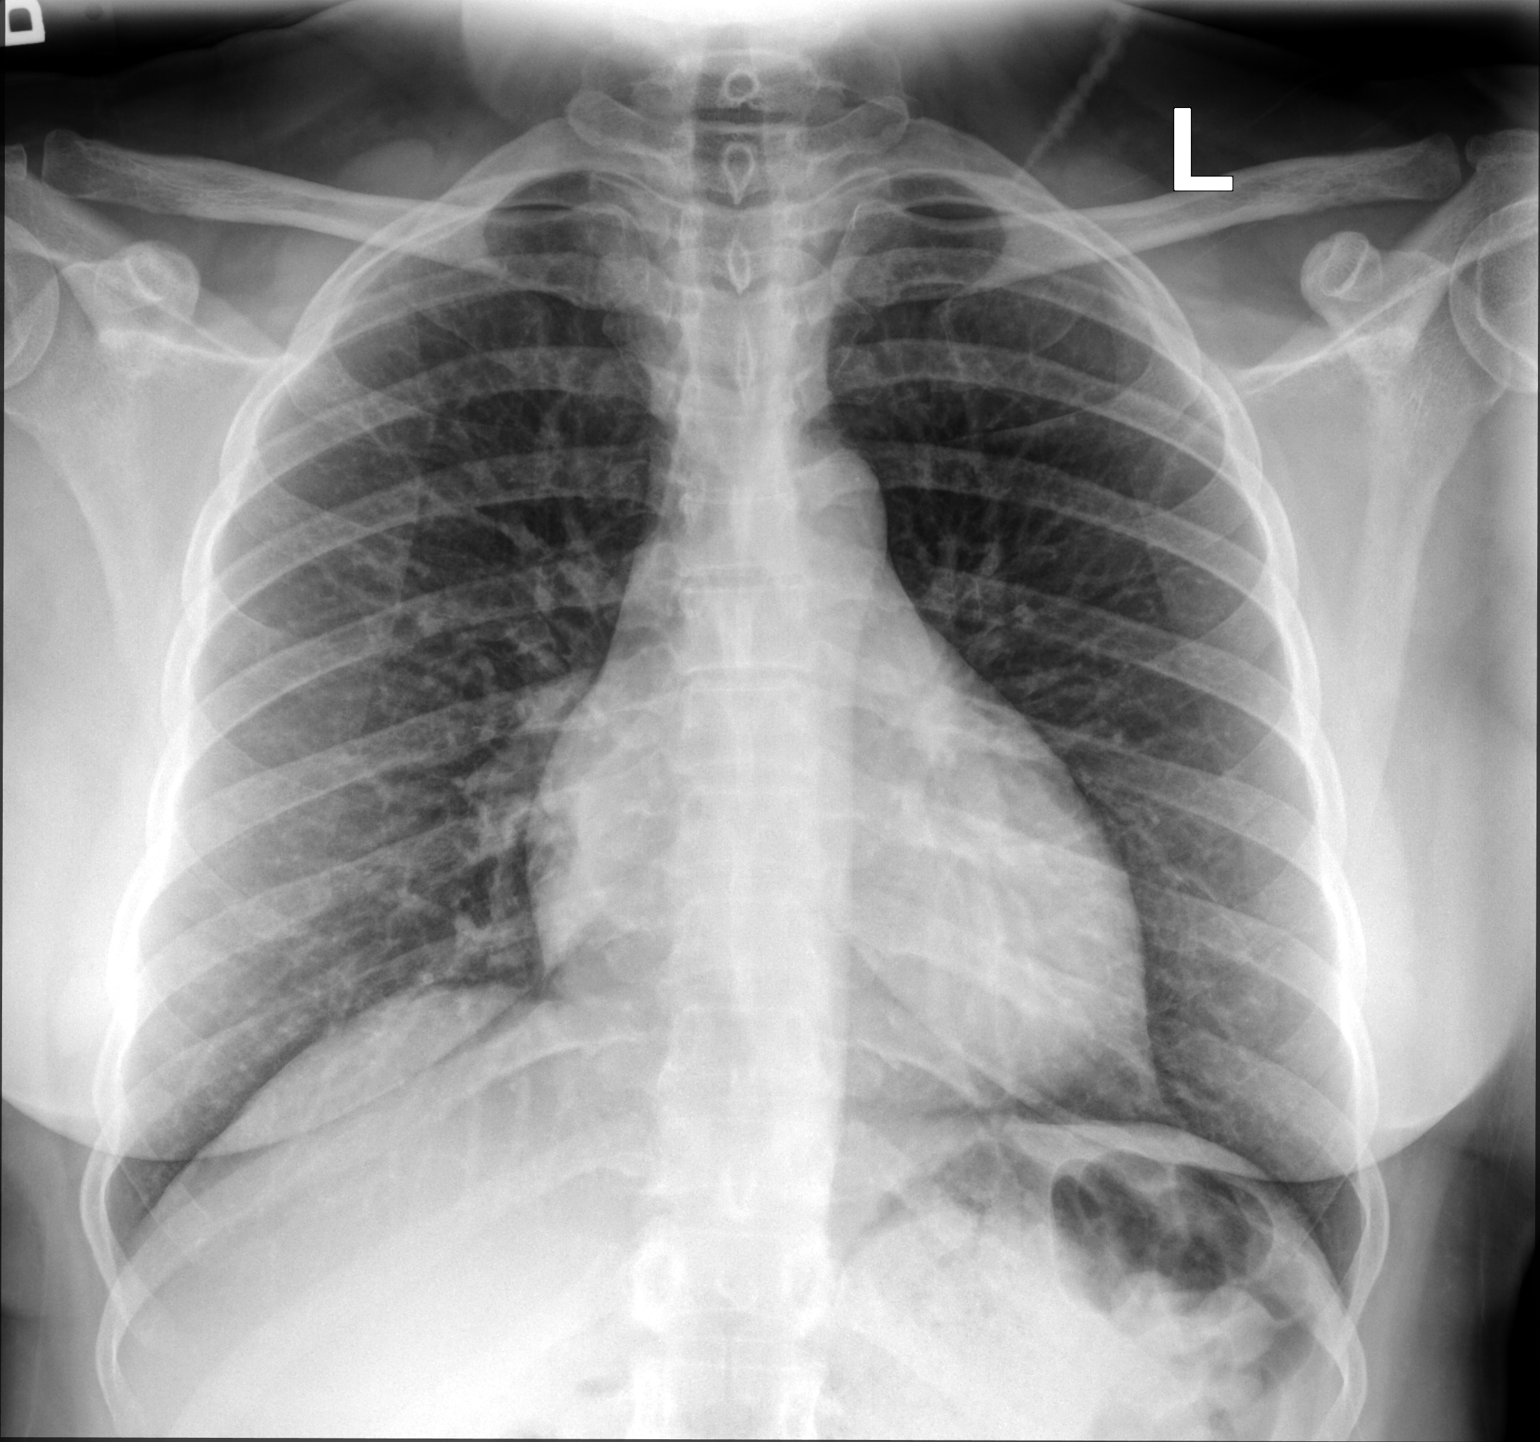

[chest lat]
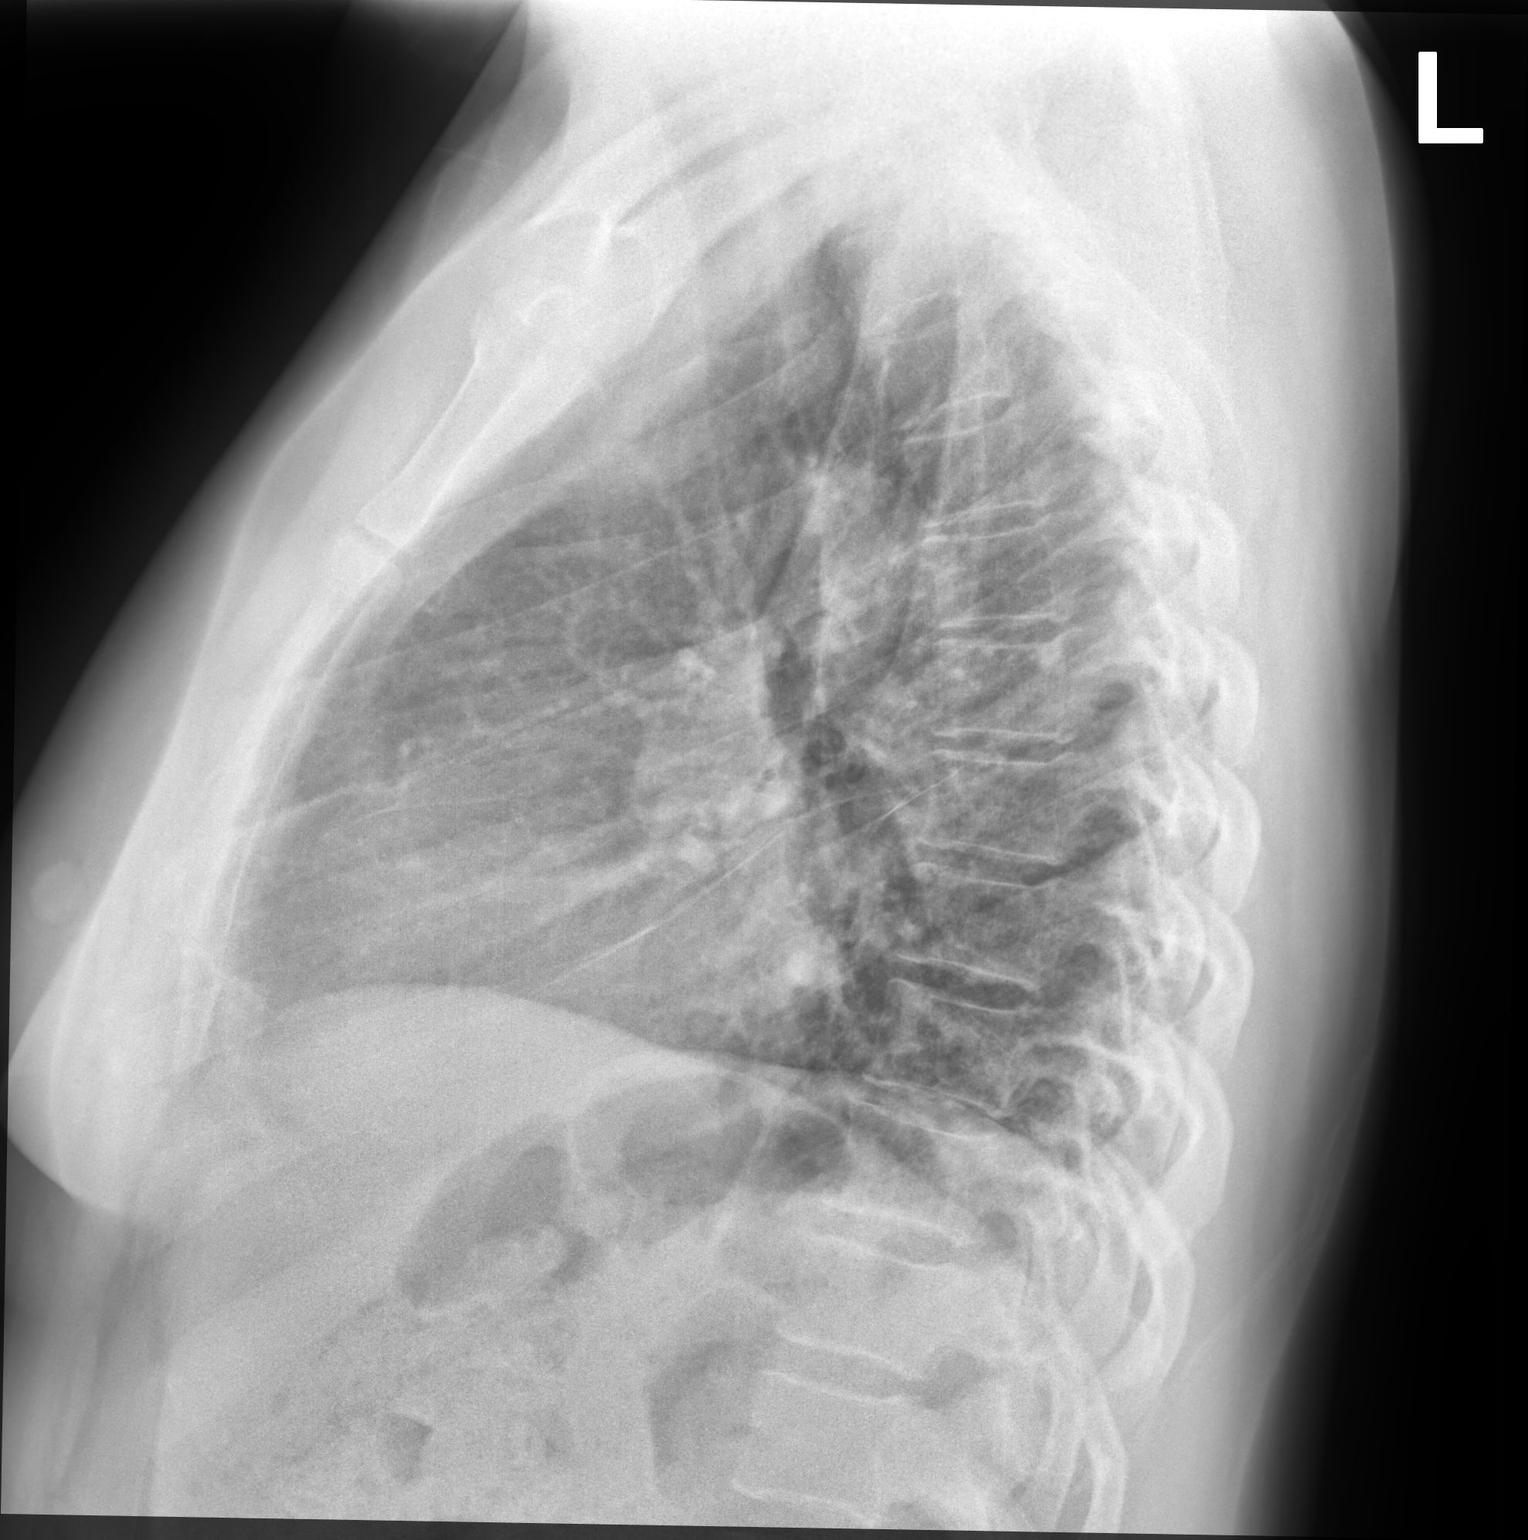

[2 of 2 positions shown; findings below may reference images not displayed]

FINDINGS: No consolidation, features of edema, pneumothorax, or effusion.
Pulmonary vascularity is normally distributed. The cardiomediastinal
contours are unremarkable. No acute osseous or soft tissue
abnormality.
IMPRESSION: No acute cardiopulmonary abnormality.

No evidence of acute or latent tubercular infection.

## 2023-01-31 ENCOUNTER — Other Ambulatory Visit: Payer: Self-pay | Admitting: Nurse Practitioner

## 2023-01-31 DIAGNOSIS — Z1212 Encounter for screening for malignant neoplasm of rectum: Secondary | ICD-10-CM

## 2023-01-31 DIAGNOSIS — Z1211 Encounter for screening for malignant neoplasm of colon: Secondary | ICD-10-CM
